# Patient Record
Sex: Female | Born: 1975 | Race: White | Hispanic: No | Smoking: Current every day smoker
Health system: Southern US, Community
[De-identification: ages and names within clinical notes are randomized; demographics above are authoritative.]

## PROBLEM LIST (undated history)

## (undated) ENCOUNTER — Ambulatory Visit

## (undated) ENCOUNTER — Encounter

## (undated) DIAGNOSIS — J069 Acute upper respiratory infection, unspecified: Secondary | ICD-10-CM

## (undated) DIAGNOSIS — I10 Essential (primary) hypertension: Secondary | ICD-10-CM

## (undated) DIAGNOSIS — T7840XA Allergy, unspecified, initial encounter: Secondary | ICD-10-CM

## (undated) DIAGNOSIS — J45909 Unspecified asthma, uncomplicated: Secondary | ICD-10-CM

## (undated) DIAGNOSIS — L309 Dermatitis, unspecified: Secondary | ICD-10-CM

## (undated) HISTORY — DX: Unspecified asthma, uncomplicated: J45.909

## (undated) HISTORY — PX: OVARIAN CYST DRAINAGE: SHX325

## (undated) HISTORY — DX: Allergy, unspecified, initial encounter: T78.40XA

## (undated) HISTORY — DX: Essential (primary) hypertension: I10

## (undated) HISTORY — PX: OTHER SURGICAL HISTORY: SHX169

## (undated) HISTORY — DX: Acute upper respiratory infection, unspecified: J06.9

---

## 2003-02-11 ENCOUNTER — Observation Stay (HOSPITAL_COMMUNITY): Admission: EM | Admit: 2003-02-11 | Discharge: 2003-02-12 | Payer: Self-pay | Admitting: Emergency Medicine

## 2004-08-22 ENCOUNTER — Emergency Department (HOSPITAL_COMMUNITY): Admission: EM | Admit: 2004-08-22 | Discharge: 2004-08-22 | Payer: Self-pay | Admitting: Family Medicine

## 2009-10-30 ENCOUNTER — Encounter: Admission: RE | Admit: 2009-10-30 | Discharge: 2009-10-30 | Payer: Self-pay | Admitting: Internal Medicine

## 2011-08-10 ENCOUNTER — Ambulatory Visit
Admission: RE | Admit: 2011-08-10 | Discharge: 2011-08-10 | Disposition: A | Discharge status: Home / Self Care | Payer: BC Managed Care – PPO | Source: Ambulatory Visit | Attending: Nurse Practitioner | Admitting: Nurse Practitioner

## 2011-08-10 ENCOUNTER — Other Ambulatory Visit: Payer: Self-pay | Admitting: Nurse Practitioner

## 2011-08-10 DIAGNOSIS — R05 Cough: Secondary | ICD-10-CM

## 2011-08-10 DIAGNOSIS — R059 Cough, unspecified: Secondary | ICD-10-CM

## 2011-08-10 DIAGNOSIS — R509 Fever, unspecified: Secondary | ICD-10-CM

## 2012-07-05 ENCOUNTER — Other Ambulatory Visit: Payer: Self-pay | Admitting: *Deleted

## 2012-07-05 DIAGNOSIS — I10 Essential (primary) hypertension: Secondary | ICD-10-CM

## 2012-07-05 MED ORDER — HYDROCHLOROTHIAZIDE 25 MG PO TABS: ORAL_TABLET | ORAL | Status: DC

## 2012-07-05 NOTE — Telephone Encounter (Signed)
  This message came to me. Looks like the refill did not go through. Please try again.

## 2012-07-06 ENCOUNTER — Other Ambulatory Visit: Payer: Self-pay | Admitting: *Deleted

## 2012-07-06 DIAGNOSIS — I10 Essential (primary) hypertension: Secondary | ICD-10-CM

## 2012-07-06 MED ORDER — HYDROCHLOROTHIAZIDE 25 MG PO TABS: ORAL_TABLET | ORAL | Status: DC

## 2012-07-19 ENCOUNTER — Other Ambulatory Visit: Payer: Self-pay | Admitting: *Deleted

## 2012-07-19 MED ORDER — FLUTICASONE-SALMETEROL 500-50 MCG/DOSE IN AEPB: INHALATION_SPRAY | RESPIRATORY_TRACT | Status: DC

## 2012-08-02 ENCOUNTER — Encounter: Payer: Self-pay | Admitting: Nurse Practitioner

## 2012-08-02 ENCOUNTER — Ambulatory Visit (INDEPENDENT_AMBULATORY_CARE_PROVIDER_SITE_OTHER): Payer: BC Managed Care – PPO | Admitting: Nurse Practitioner

## 2012-08-02 VITALS — BP 122/86 | HR 70 | Temp 97.5°F | Resp 20 | Ht 63.5 in | Wt 120.0 lb

## 2012-08-02 DIAGNOSIS — J45909 Unspecified asthma, uncomplicated: Secondary | ICD-10-CM

## 2012-08-02 DIAGNOSIS — L309 Dermatitis, unspecified: Secondary | ICD-10-CM | POA: Insufficient documentation

## 2012-08-02 DIAGNOSIS — I10 Essential (primary) hypertension: Secondary | ICD-10-CM

## 2012-08-02 DIAGNOSIS — Z79899 Other long term (current) drug therapy: Secondary | ICD-10-CM

## 2012-08-02 DIAGNOSIS — J069 Acute upper respiratory infection, unspecified: Secondary | ICD-10-CM | POA: Insufficient documentation

## 2012-08-02 DIAGNOSIS — F17200 Nicotine dependence, unspecified, uncomplicated: Secondary | ICD-10-CM

## 2012-08-02 DIAGNOSIS — J189 Pneumonia, unspecified organism: Secondary | ICD-10-CM | POA: Insufficient documentation

## 2012-08-02 DIAGNOSIS — M6740 Ganglion, unspecified site: Secondary | ICD-10-CM | POA: Insufficient documentation

## 2012-08-02 DIAGNOSIS — B079 Viral wart, unspecified: Secondary | ICD-10-CM

## 2012-08-02 DIAGNOSIS — L2089 Other atopic dermatitis: Secondary | ICD-10-CM

## 2012-08-02 DIAGNOSIS — L989 Disorder of the skin and subcutaneous tissue, unspecified: Secondary | ICD-10-CM | POA: Insufficient documentation

## 2012-08-02 DIAGNOSIS — J309 Allergic rhinitis, unspecified: Secondary | ICD-10-CM

## 2012-08-02 MED ORDER — HYDROCORTISONE 2.5 % EX OINT: TOPICAL_OINTMENT | Freq: Two times a day (BID) | CUTANEOUS | Status: DC

## 2012-08-02 MED ORDER — EPINEPHRINE 0.3 MG/0.3ML IJ DEVI: 0.3000 mg | Freq: Once | INTRAMUSCULAR | Status: DC

## 2012-08-02 MED ORDER — HYDROCHLOROTHIAZIDE 25 MG PO TABS: ORAL_TABLET | ORAL | Status: DC

## 2012-08-02 MED ORDER — PREDNISONE 10 MG PO TABS: 10.0000 mg | ORAL_TABLET | Freq: Every day | ORAL | Status: DC

## 2012-08-02 MED ORDER — FLUTICASONE-SALMETEROL 500-50 MCG/DOSE IN AEPB: INHALATION_SPRAY | RESPIRATORY_TRACT | Status: DC

## 2012-08-02 MED ORDER — ALBUTEROL SULFATE HFA 108 (90 BASE) MCG/ACT IN AERS: 2.0000 | INHALATION_SPRAY | Freq: Four times a day (QID) | RESPIRATORY_TRACT | Status: DC | PRN

## 2012-08-02 NOTE — Progress Notes (Signed)
Patient ID: Cindy Huynh, female   DOB: [birth year], 37 y.o.   MRN: [id number]   Allergies  Allergen Reactions  . Other     Avocados, nuts, red berries in wine seafood    Chief Complaint  Patient presents with  . Annual Exam    HPI: Patient is a 37 y.o. female seen in the office today for EV Reports she goes to GYN yearly  Would like to quit smoking  Using advair daily-- uses albuterol 1-2 times a week at most   Review of Systems:  Review of Systems  Constitutional: Negative for fever, chills and weight loss.       Doing well- recently got a separation and reports stress levels have improve  HENT: Negative for neck pain.   Eyes: Negative.        See eye MD regularly   Cardiovascular: Negative for chest pain, palpitations and leg swelling.  Gastrointestinal: Negative for nausea, vomiting, abdominal pain, diarrhea and constipation.  Genitourinary: Negative for dysuria, urgency and frequency.  Musculoskeletal: Negative.  Negative for myalgias and joint pain.  Skin: Negative for itching and rash.       Recurrent eczema uses PRN prednisone burst and taper   Neurological: Positive for headaches (behind right eye- takes advil and it goes away ).  Psychiatric/Behavioral: Negative for depression. The patient is not nervous/anxious and does not have insomnia.     Past Medical History  Diagnosis Date  . Allergy   . Asthma   . Hypertension    Past Surgical History  Procedure Laterality Date  . None     Social History:   reports that she has been smoking Cigarettes.  She has been smoking about 1.00 pack per day. She does not have any smokeless tobacco history on file. She reports that  drinks alcohol. She reports that she does not use illicit drugs.  History reviewed. No pertinent family history.  Medications: Patient's Medications  New Prescriptions   EPINEPHRINE (EPIPEN) 0.3 MG/0.3 ML DEVI    Inject 0.3 mLs (0.3 mg total) into the muscle once.  Previous Medications   EPINEPHRINE (EPIPEN IJ)    Inject as directed. Use as needed  Modified Medications   Modified Medication Previous Medication   ALBUTEROL (VENTOLIN HFA) 108 (90 BASE) MCG/ACT INHALER albuterol (VENTOLIN HFA) 108 (90 BASE) MCG/ACT inhaler      Inhale 2 puffs into the lungs every 6 (six) hours as needed for wheezing. Inhale two puffs daily for asthma    Inhale 2 puffs into the lungs every 6 (six) hours as needed for wheezing. Inhale two puffs daily for asthma   FLUTICASONE-SALMETEROL (ADVAIR) 500-50 MCG/DOSE AEPB Fluticasone-Salmeterol (ADVAIR) 500-50 MCG/DOSE AEPB      Inhale 1 puff twice a day to help breathing    Inhale 1 puff twice a day to help breathing   HYDROCHLOROTHIAZIDE (HYDRODIURIL) 25 MG TABLET hydrochlorothiazide (HYDRODIURIL) 25 MG tablet      Take one tablet once a day to help control blood pressure.    Take one tablet once a day to help control blood pressure.   HYDROCORTISONE 2.5 % OINTMENT hydrocortisone 2.5 % ointment      Apply topically 2 (two) times daily. Apply daily after shower    Apply topically 2 (two) times daily. Apply daily after shower   PREDNISONE (DELTASONE) 10 MG TABLET predniSONE (DELTASONE) 10 MG tablet      Take 1 tablet (10 mg total) by mouth daily. Take 2 tablets daily for eczema  Take 10 mg by mouth daily. Take 2 tablets daily for eczema  Discontinued Medications   No medications on file     Physical Exam:  Filed Vitals:   08/02/12 1515  BP: 122/86  Pulse: 70  Temp: 97.5 F (36.4 C)  Resp: 20  Height: 5' 3.5" (1.613 m)  Weight: 120 lb (54.432 kg)   Physical Exam  Constitutional: She is oriented to person, place, and time. She appears well-developed and well-nourished. No distress.  HENT:  Head: Normocephalic and atraumatic.  Right Ear: External ear normal.  Left Ear: External ear normal.  Nose: Nose normal.  Mouth/Throat: Oropharynx is clear and moist. No oropharyngeal exudate.  Eyes: Conjunctivae and EOM are normal. Pupils are equal,  round, and reactive to light.  Neck: Normal range of motion. Neck supple.  Cardiovascular: Normal rate, regular rhythm, normal heart sounds and intact distal pulses.   Pulmonary/Chest: Effort normal and breath sounds normal.  Abdominal: Soft. Bowel sounds are normal. She exhibits no distension. There is no tenderness.  Genitourinary:  has exam at GYN  Musculoskeletal: Normal range of motion. She exhibits no edema and no tenderness.  Neurological: She is alert and oriented to person, place, and time. She has normal reflexes. No cranial nerve deficit.  Skin: Skin is warm and dry. No rash noted. She is not diaphoretic. No erythema. No pallor.  Psychiatric: She has a normal mood and affect.   Assessment/Plan Unspecified essential hypertension Stable taking HCTZ only. Will get bmp today  Intrinsic asthma, unspecified Stable on current medications.   Allergic rhinitis, cause unspecified Stable on current medications. Will cont these medications.  Other atopic dermatitis and related conditions With recurrent eczema - pt uses prednose as needed for flares. Has not had recent flare will cont current medications   Tobacco use disorder Encouraged smoking cessation. Pt does not want to try any medication at this time but is thinking about cutting back on her amount of cigarette   Encounter for long-term (current) use of other medications Will do cbc and cmp    Wart on left posterior hand between thumb and first finger. Cryo pen applied and wart frozen. Pt given instructions on care

## 2012-08-02 NOTE — Assessment & Plan Note (Signed)
Stable on current medications. Will cont these medications.

## 2012-08-02 NOTE — Assessment & Plan Note (Signed)
Stable taking HCTZ only. Will get bmp today

## 2012-08-02 NOTE — Assessment & Plan Note (Signed)
Encouraged smoking cessation. Pt does not want to try any medication at this time but is thinking about cutting back on her amount of cigarette

## 2012-08-02 NOTE — Assessment & Plan Note (Signed)
Stable on current medications 

## 2012-08-02 NOTE — Patient Instructions (Addendum)

## 2012-08-02 NOTE — Assessment & Plan Note (Signed)
Will do cbc and cmp

## 2012-08-02 NOTE — Assessment & Plan Note (Signed)
With recurrent eczema - pt uses prednose as needed for flares. Has not had recent flare will cont current medications

## 2012-08-03 LAB — COMPREHENSIVE METABOLIC PANEL
AST: 20 IU/L (ref 0–40)
Alkaline Phosphatase: 36 IU/L — ABNORMAL LOW (ref 39–117)
BUN/Creatinine Ratio: 17 (ref 8–20)
CO2: 27 mmol/L (ref 19–28)
Calcium: 10.2 mg/dL (ref 8.7–10.2)
GFR calc Af Amer: 96 mL/min/{1.73_m2} (ref >59)
Globulin, Total: 2 g/dL (ref 1.5–4.5)
Potassium: 4.1 mmol/L (ref 3.5–5.2)
Sodium: 143 mmol/L (ref 134–144)
Total Bilirubin: 0.5 mg/dL (ref 0.0–1.2)

## 2012-08-03 LAB — CBC WITH DIFFERENTIAL/PLATELET
Basophils Absolute: 0 10*3/uL (ref 0.0–0.2)
HCT: 43.9 % (ref 34.0–46.6)
Immature Grans (Abs): 0 10*3/uL (ref 0.0–0.1)
Immature Granulocytes: 0 % (ref 0–2)
MCHC: 34.9 g/dL (ref 31.5–35.7)
MCV: 95 fL (ref 79–97)
Monocytes: 8 % (ref 4–12)
Neutrophils Absolute: 3.9 10*3/uL (ref 1.4–7.0)
Neutrophils Relative %: 53 % (ref 40–74)
RBC: 4.63 x10E6/uL (ref 3.77–5.28)
RDW: 13.5 % (ref 12.3–15.4)

## 2013-01-16 ENCOUNTER — Other Ambulatory Visit: Payer: Self-pay | Admitting: Internal Medicine

## 2013-01-23 ENCOUNTER — Emergency Department (HOSPITAL_COMMUNITY)
Admission: EM | Admit: 2013-01-23 | Discharge: 2013-01-23 | Disposition: A | Discharge status: Home / Self Care | Payer: BC Managed Care – PPO | Attending: Emergency Medicine | Admitting: Emergency Medicine

## 2013-01-23 ENCOUNTER — Encounter (HOSPITAL_COMMUNITY): Payer: Self-pay | Admitting: *Deleted

## 2013-01-23 ENCOUNTER — Emergency Department (HOSPITAL_COMMUNITY): Payer: BC Managed Care – PPO

## 2013-01-23 DIAGNOSIS — J45909 Unspecified asthma, uncomplicated: Secondary | ICD-10-CM | POA: Insufficient documentation

## 2013-01-23 DIAGNOSIS — I10 Essential (primary) hypertension: Secondary | ICD-10-CM | POA: Insufficient documentation

## 2013-01-23 DIAGNOSIS — Z872 Personal history of diseases of the skin and subcutaneous tissue: Secondary | ICD-10-CM | POA: Insufficient documentation

## 2013-01-23 DIAGNOSIS — Z79899 Other long term (current) drug therapy: Secondary | ICD-10-CM | POA: Insufficient documentation

## 2013-01-23 DIAGNOSIS — N83209 Unspecified ovarian cyst, unspecified side: Secondary | ICD-10-CM | POA: Insufficient documentation

## 2013-01-23 DIAGNOSIS — Z3202 Encounter for pregnancy test, result negative: Secondary | ICD-10-CM | POA: Insufficient documentation

## 2013-01-23 DIAGNOSIS — F17200 Nicotine dependence, unspecified, uncomplicated: Secondary | ICD-10-CM | POA: Insufficient documentation

## 2013-01-23 HISTORY — DX: Dermatitis, unspecified: L30.9

## 2013-01-23 LAB — CBC WITH DIFFERENTIAL/PLATELET
Basophils Absolute: 0 10*3/uL (ref 0.0–0.1)
Hemoglobin: 14.4 g/dL (ref 12.0–15.0)
Lymphocytes Relative: 32 % (ref 12–46)
Lymphs Abs: 2.2 10*3/uL (ref 0.7–4.0)
MCH: 33.6 pg (ref 26.0–34.0)
MCHC: 35.9 g/dL (ref 30.0–36.0)
Neutrophils Relative %: 54 % (ref 43–77)
Platelets: 216 10*3/uL (ref 150–400)
RBC: 4.29 MIL/uL (ref 3.87–5.11)
RDW: 12.5 % (ref 11.5–15.5)

## 2013-01-23 LAB — COMPREHENSIVE METABOLIC PANEL
ALT: 12 U/L (ref 0–35)
AST: 16 U/L (ref 0–37)
Albumin: 3.6 g/dL (ref 3.5–5.2)
Alkaline Phosphatase: 39 U/L (ref 39–117)
BUN: 11 mg/dL (ref 6–23)
Calcium: 8.9 mg/dL (ref 8.4–10.5)
Total Bilirubin: 0.1 mg/dL — ABNORMAL LOW (ref 0.3–1.2)

## 2013-01-23 LAB — GC/CHLAMYDIA PROBE AMP: CT Probe RNA: NEGATIVE

## 2013-01-23 LAB — POCT PREGNANCY, URINE: Preg Test, Ur: NEGATIVE

## 2013-01-23 LAB — ETHANOL: Alcohol, Ethyl (B): <11 mg/dL (ref 0–11)

## 2013-01-23 LAB — URINALYSIS, ROUTINE W REFLEX MICROSCOPIC
Glucose, UA: NEGATIVE mg/dL
Hgb urine dipstick: NEGATIVE
Leukocytes, UA: NEGATIVE
Specific Gravity, Urine: 1.024 (ref 1.005–1.030)
pH: 8 (ref 5.0–8.0)

## 2013-01-23 LAB — WET PREP, GENITAL: Trich, Wet Prep: NONE SEEN

## 2013-01-23 NOTE — ED Notes (Signed)
Pt states that she has been havign right flank pain that is intermittant and that is severe when it does start. Pt states pain wraps from her back to abdomen on her right. Pt states that she urinated at the pain started. Pt states she tried to pass gas and she was unable pt has ETOH on breath.

## 2013-01-23 NOTE — ED Notes (Signed)
Pt updated about [place] process and informed sometimes it can take a while for the [place] tech to arrive. Pt communicated understanding.

## 2013-01-23 NOTE — ED Provider Notes (Signed)
CSN: [id number]     Arrival date & time 01/23/13  0107 History   First MD Initiated Contact with Patient 01/23/13 0125     Chief Complaint  Patient presents with  . Abdominal Pain   (Consider location/radiation/quality/duration/timing/severity/associated sxs/prior Treatment) HPI This is a 37 year old female who had the sudden onset of severe right-sided abdominal pain radiating to her right flank. This occurred about one hour ago. It was worse with movement or ambulation. She describes it as a burning sensation. It is subsequently significantly improved and is now only mild. There was no associated nausea or vomiting. She is not aware of having any hematuria. She has had no dysuria. She has had no diarrhea. She has no vaginal bleeding or discharge.  Past Medical History  Diagnosis Date  . Allergy   . Asthma   . Hypertension   . Eczema    Past Surgical History  Procedure Laterality Date  . None     History reviewed. No pertinent family history. History  Substance Use Topics  . Smoking status: Current Every Day Smoker -- 1.00 packs/day    Types: Cigarettes  . Smokeless tobacco: Not on file  . Alcohol Use: Yes   OB History   Grav Para Term Preterm Abortions TAB SAB Ect Mult Living                 Review of Systems  All other systems reviewed and are negative.    Allergies  Peanuts; Shellfish allergy; Avocado; Macrolides and ketolides; Other; and Erythromycin  Home Medications   Current Outpatient Rx  Name  Route  Sig  Dispense  Refill  . albuterol (PROVENTIL HFA;VENTOLIN HFA) 108 (90 BASE) MCG/ACT inhaler   Inhalation   Inhale 2 puffs into the lungs every 6 (six) hours as needed for wheezing.         . cetirizine (ZYRTEC) 10 MG tablet   Oral   Take 10 mg by mouth daily.         . Fluticasone-Salmeterol (ADVAIR) 500-50 MCG/DOSE AEPB   Inhalation   Inhale 1 puff into the lungs every 12 (twelve) hours.         . hydrochlorothiazide (HYDRODIURIL) 25 MG  tablet   Oral   Take 25 mg by mouth daily.         . hydrocortisone 2.5 % ointment   Topical   Apply 1 application topically 3 (three) times daily.         Marland Kitchen ibuprofen (ADVIL,MOTRIN) 200 MG tablet   Oral   Take 400 mg by mouth every 6 (six) hours as needed for pain.         . predniSONE (DELTASONE) 10 MG tablet   Oral   Take 10 mg by mouth daily. Takes for 10 days for cyclical treatment for excema         . Pseudoeph-Doxylamine-DM-APAP (NYQUIL D COLD/FLU PO)   Oral   Take 30 mLs by mouth daily as needed (for cold).         . Skin Protectants, Misc. (EUCERIN) cream   Topical   Apply 1 application topically 3 (three) times daily.         Marland Kitchen EPINEPHrine (EPIPEN) 0.3 mg/0.3 mL DEVI   Intramuscular   Inject 0.3 mLs (0.3 mg total) into the muscle once.   1 Device   3    BP 116/72  Pulse 65  Temp(Src) 97.9 F (36.6 C) (Oral)  Resp 17  Ht 5\' 4"  (1.626 m)  Wt 125 lb 4 oz (56.813 kg)  BMI 21.49 kg/m2  SpO2 98%  Physical Exam General: Well-developed, well-nourished female in no acute distress; appearance consistent with age of record HENT: normocephalic; atraumatic Eyes: pupils equal, round and reactive to light; extraocular muscles intact Neck: supple Heart: regular rate and rhythm; no murmurs, rubs or gallops Lungs: clear to auscultation bilaterally Abdomen: soft; nondistended; right suprapubic tenderness; negative Murphy's sign; no masses or hepatosplenomegaly; bowel sounds present GU: No CVA tenderness; normal external genitalia; cervical os closed; no vaginal bleeding; no vaginal discharge; cervical motion tenderness; left adnexal tenderness Extremities: No deformity; full range of motion; pulses normal Neurologic: Awake, alert and oriented; motor function intact in all extremities and symmetric; no facial droop Skin: Warm and dry; eczematous rash of ankles Psychiatric: Normal mood and affect    ED Course  Procedures (including critical care time)  MDM    Nursing notes and vitals signs, including pulse oximetry, reviewed.  Summary of this visit's results, reviewed by myself:  Labs:  Results for orders placed during the hospital encounter of 01/23/13 (from the past 24 hour(s))  CBC WITH DIFFERENTIAL     Status: Abnormal   Collection Time    01/23/13  1:30 AM      Result Value Range   WBC 6.9  4.0 - 10.5 K/uL   RBC 4.29  3.87 - 5.11 MIL/uL   Hemoglobin 14.4  12.0 - 15.0 g/dL   HCT 11.9  14.7 - 82.9 %   MCV 93.5  78.0 - 100.0 fL   MCH 33.6  26.0 - 34.0 pg   MCHC 35.9  30.0 - 36.0 g/dL   RDW 56.2  13.0 - 86.5 %   Platelets 216  150 - 400 K/uL   Neutrophils Relative % 54  43 - 77 %   Neutro Abs 3.7  1.7 - 7.7 K/uL   Lymphocytes Relative 32  12 - 46 %   Lymphs Abs 2.2  0.7 - 4.0 K/uL   Monocytes Relative 6  3 - 12 %   Monocytes Absolute 0.4  0.1 - 1.0 K/uL   Eosinophils Relative 8 (*) 0 - 5 %   Eosinophils Absolute 0.5  0.0 - 0.7 K/uL   Basophils Relative 0  0 - 1 %   Basophils Absolute 0.0  0.0 - 0.1 K/uL  COMPREHENSIVE METABOLIC PANEL     Status: Abnormal   Collection Time    01/23/13  1:30 AM      Result Value Range   Sodium 138  135 - 145 mEq/L   Potassium 3.7  3.5 - 5.1 mEq/L   Chloride 98  96 - 112 mEq/L   CO2 27  19 - 32 mEq/L   Glucose, Bld 95  70 - 99 mg/dL   BUN 11  6 - 23 mg/dL   Creatinine, Ser 7.84  0.50 - 1.10 mg/dL   Calcium 8.9  8.4 - 69.6 mg/dL   Total Protein 6.4  6.0 - 8.3 g/dL   Albumin 3.6  3.5 - 5.2 g/dL   AST 16  0 - 37 U/L   ALT 12  0 - 35 U/L   Alkaline Phosphatase 39  39 - 117 U/L   Total Bilirubin 0.1 (*) 0.3 - 1.2 mg/dL   GFR calc non Af Amer >90  >90 mL/min   GFR calc Af Amer >90  >90 mL/min  ETHANOL     Status: None   Collection Time    01/23/13  1:55  AM      Result Value Range   Alcohol, Ethyl (B) <11  0 - 11 mg/dL  URINALYSIS, ROUTINE W REFLEX MICROSCOPIC     Status: None   Collection Time    02-01-2013  2:15 AM      Result Value Range   Color, Urine YELLOW  YELLOW   APPearance  CLEAR  CLEAR   Specific Gravity, Urine 1.024  1.005 - 1.030   pH 8.0  5.0 - 8.0   Glucose, UA NEGATIVE  NEGATIVE mg/dL   Hgb urine dipstick NEGATIVE  NEGATIVE   Bilirubin Urine NEGATIVE  NEGATIVE   Ketones, ur NEGATIVE  NEGATIVE mg/dL   Protein, ur NEGATIVE  NEGATIVE mg/dL   Urobilinogen, UA 0.2  0.0 - 1.0 mg/dL   Nitrite NEGATIVE  NEGATIVE   Leukocytes, UA NEGATIVE  NEGATIVE  WET PREP, GENITAL     Status: Abnormal   Collection Time    01-Feb-2013  2:20 AM      Result Value Range   Yeast Wet Prep HPF POC NONE SEEN  NONE SEEN   Trich, Wet Prep NONE SEEN  NONE SEEN   Clue Cells Wet Prep HPF POC NONE SEEN  NONE SEEN   WBC, Wet Prep HPF POC FEW (*) NONE SEEN  POCT PREGNANCY, URINE     Status: None   Collection Time    February 01, 2013  2:39 AM      Result Value Range   Preg Test, Ur NEGATIVE  NEGATIVE    Imaging Studies: US Transvaginal Non-ob  02/01/2013   *RADIOLOGY REPORT*  Clinical Data: Pelvic pain.  TRANSABDOMINAL AND TRANSVAGINAL ULTRASOUND OF PELVIS Technique:  Both transabdominal and transvaginal ultrasound examinations of the pelvis were performed. Transabdominal technique was performed for global imaging of the pelvis including uterus, ovaries, adnexal regions, and pelvic cul-de-sac.  It was necessary to proceed with endovaginal exam following the transabdominal exam to visualize the uterus and ovaries in greater detail.  Comparison:  None  Findings:  Uterus: Normal in size and appearance; measures 8.3 x 3.8 x 4.9 cm.  Endometrium: Not well characterized due to an intrauterine device, which is noted in expected position at the fundus of the uterus.  Right ovary:  Measures 4.4 x 3.2 x 3.9 cm.  A relatively complex 3.3 x 2.8 x 1.6 cm cyst is noted within the right ovary, with lace- like internal structure, raising question for a hemorrhagic cyst.  Left ovary: Normal appearance/no adnexal mass; measures 3.7 x 2.2 x 2.4 cm.  Limited Doppler evaluation demonstrates normal color Doppler blood flow  with respect to both ovaries; there is no evidence for ovarian torsion.  Other findings: A small amount of free fluid is noted at the right adnexa.  IMPRESSION:  1.  Relatively complex 3.3 cm right ovarian cyst, with lace-like internal septations, raising concern for a hemorrhagic cyst. 2.  Intrauterine device noted in expected position at the fundus of the uterus. 3.  Small amount of free fluid at the right adnexa is likely physiologic in nature.   Original Report Authenticated By: Tonia Ghent, M.D.   US Pelvis Complete  02/01/13   *RADIOLOGY REPORT*  Clinical Data: Pelvic pain.  TRANSABDOMINAL AND TRANSVAGINAL ULTRASOUND OF PELVIS Technique:  Both transabdominal and transvaginal ultrasound examinations of the pelvis were performed. Transabdominal technique was performed for global imaging of the pelvis including uterus, ovaries, adnexal regions, and pelvic cul-de-sac.  It was necessary to proceed with endovaginal exam following the transabdominal exam to visualize the  uterus and ovaries in greater detail.  Comparison:  None  Findings:  Uterus: Normal in size and appearance; measures 8.3 x 3.8 x 4.9 cm.  Endometrium: Not well characterized due to an intrauterine device, which is noted in expected position at the fundus of the uterus.  Right ovary:  Measures 4.4 x 3.2 x 3.9 cm.  A relatively complex 3.3 x 2.8 x 1.6 cm cyst is noted within the right ovary, with lace- like internal structure, raising question for a hemorrhagic cyst.  Left ovary: Normal appearance/no adnexal mass; measures 3.7 x 2.2 x 2.4 cm.  Limited Doppler evaluation demonstrates normal color Doppler blood flow with respect to both ovaries; there is no evidence for ovarian torsion.  Other findings: A small amount of free fluid is noted at the right adnexa.  IMPRESSION:  1.  Relatively complex 3.3 cm right ovarian cyst, with lace-like internal septations, raising concern for a hemorrhagic cyst. 2.  Intrauterine device noted in expected  position at the fundus of the uterus. 3.  Small amount of free fluid at the right adnexa is likely physiologic in nature.   Original Report Authenticated By: Tonia Ghent, M.D.   6:21 AM Pain-free at this time. Advised of ultrasound findings. She has followup appointment with her OB/GYN, Dr. Rana Snare, later this month for colposcopy.     Hanley Seamen, MD 01/23/13 [phone]

## 2013-01-23 NOTE — ED Notes (Signed)
Pt denies burning on urination, denies n/v/d, Denies SOB, denies fever/chills.

## 2013-04-14 ENCOUNTER — Other Ambulatory Visit: Payer: Self-pay | Admitting: Nurse Practitioner

## 2013-04-25 ENCOUNTER — Telehealth: Payer: Self-pay | Admitting: *Deleted

## 2013-04-25 NOTE — Telephone Encounter (Signed)
Received a letter from [employer] plan that states since patient is on Prednisone to consider Osteoporosis Screening. Per Dr. Burnice LoganPandey----Patient needs follow up appointment. 04/06/2013--LM for patient to return call 04/25/2013--LM for patient to call to make follow up appointment. Sent letter to be scanned.

## 2013-06-27 ENCOUNTER — Other Ambulatory Visit: Payer: Self-pay | Admitting: Nurse Practitioner

## 2013-07-20 ENCOUNTER — Ambulatory Visit (INDEPENDENT_AMBULATORY_CARE_PROVIDER_SITE_OTHER): Payer: BC Managed Care – PPO | Admitting: Nurse Practitioner

## 2013-07-20 ENCOUNTER — Encounter: Payer: Self-pay | Admitting: Nurse Practitioner

## 2013-07-20 VITALS — BP 120/84 | HR 71 | Temp 97.7°F | Ht 64.0 in | Wt 117.2 lb

## 2013-07-20 DIAGNOSIS — Z23 Encounter for immunization: Secondary | ICD-10-CM

## 2013-07-20 DIAGNOSIS — F17200 Nicotine dependence, unspecified, uncomplicated: Secondary | ICD-10-CM

## 2013-07-20 DIAGNOSIS — I10 Essential (primary) hypertension: Secondary | ICD-10-CM

## 2013-07-20 DIAGNOSIS — L309 Dermatitis, unspecified: Secondary | ICD-10-CM

## 2013-07-20 DIAGNOSIS — J309 Allergic rhinitis, unspecified: Secondary | ICD-10-CM

## 2013-07-20 DIAGNOSIS — J45909 Unspecified asthma, uncomplicated: Secondary | ICD-10-CM

## 2013-07-20 DIAGNOSIS — L259 Unspecified contact dermatitis, unspecified cause: Secondary | ICD-10-CM

## 2013-07-20 MED ORDER — PREDNISONE 20 MG PO TABS: ORAL_TABLET | ORAL | Status: DC

## 2013-07-20 MED ORDER — HYDROCORTISONE 2.5 % EX OINT: TOPICAL_OINTMENT | CUTANEOUS | Status: DC

## 2013-07-20 MED ORDER — FLUTICASONE-SALMETEROL 500-50 MCG/DOSE IN AEPB: 1.0000 | INHALATION_SPRAY | Freq: Two times a day (BID) | RESPIRATORY_TRACT | Status: DC

## 2013-07-20 MED ORDER — HYDROCHLOROTHIAZIDE 25 MG PO TABS: ORAL_TABLET | ORAL | Status: DC

## 2013-07-20 MED ORDER — ALBUTEROL SULFATE HFA 108 (90 BASE) MCG/ACT IN AERS: 2.0000 | INHALATION_SPRAY | Freq: Four times a day (QID) | RESPIRATORY_TRACT | Status: DC | PRN

## 2013-07-20 NOTE — Progress Notes (Signed)
Patient ID: Cindy Huynh, female   DOB: [birth year], 38 y.o.   MRN: [id number]    Allergies  Allergen Reactions  . Peanuts [Peanut Oil] Anaphylaxis    All Nuts cause same reaction  . Shellfish Allergy Anaphylaxis, Itching and Rash  . Avocado Nausea And Vomiting  . Macrolides And Ketolides Nausea And Vomiting  . Other Itching and Rash    All red berries, tomatoes  . Erythromycin Nausea Only    Chief Complaint  Patient presents with  . Medical Managment of Chronic Issues    f/u & refills (want predinisone refilled as well)  . Immunizations    will get Tdap today in office, also needs Pneumo    HPI: Patient is a 38 y.o. female seen in the office today for routine followup Has cut back on smoking to half of what she has before-- trying to save money-- has a plan to cut back further eczema has used prednisone 3 times in the last year, will sometimes have flares close together but other time she can go 7 month without  Asthma- no recent flares, well controlled, has not had resue inhaler refilled in 2 months Seasonal allergies really bad at this time.  Otherwise doing good Review of Systems:  Review of Systems  Constitutional: Negative for fever, chills and malaise/fatigue.  HENT: Positive for congestion (allergies). Negative for sore throat.   Eyes: Negative for blurred vision.  Respiratory: Negative for cough, shortness of breath and wheezing.   Cardiovascular: Negative for chest pain and leg swelling.  Gastrointestinal: Negative for abdominal pain, diarrhea and constipation.  Genitourinary: Negative for dysuria.  Musculoskeletal: Negative for joint pain and myalgias.  Skin: Positive for itching and rash.  Neurological: Negative for weakness and headaches.  Endo/Heme/Allergies: Positive for environmental allergies.  Psychiatric/Behavioral: Negative for depression. The patient is not nervous/anxious and does not have insomnia.       Past Medical History  Diagnosis Date  .  Allergy   . Asthma   . Hypertension   . Eczema    Past Surgical History  Procedure Laterality Date  . None    . Ovarian cyst drainage     Social History:   reports that she has been smoking Cigarettes.  She has been smoking about 1.00 pack per day. She does not have any smokeless tobacco history on file. She reports that she drinks alcohol. She reports that she does not use illicit drugs.  History reviewed. No pertinent family history.  Medications: Patient's Medications  New Prescriptions   No medications on file  Previous Medications   ALBUTEROL (PROVENTIL HFA;VENTOLIN HFA) 108 (90 BASE) MCG/ACT INHALER    Inhale 2 puffs into the lungs every 6 (six) hours as needed for wheezing.   CETIRIZINE (ZYRTEC) 10 MG TABLET    Take 10 mg by mouth daily.   EPINEPHRINE (EPIPEN) 0.3 MG/0.3 ML DEVI    Inject 0.3 mLs (0.3 mg total) into the muscle once.   FLUTICASONE-SALMETEROL (ADVAIR) 500-50 MCG/DOSE AEPB    Inhale 1 puff into the lungs every 12 (twelve) hours.   HYDROCHLOROTHIAZIDE (HYDRODIURIL) 25 MG TABLET    TAKE 1 TABLET BY MOUTH DAILY TO HELP BLOOD PRESSURE   HYDROCORTISONE 2.5 % OINTMENT    APPLY TO RASH DAILY   IBUPROFEN (ADVIL,MOTRIN) 200 MG TABLET    Take 400 mg by mouth every 6 (six) hours as needed for pain.   PREDNISONE (DELTASONE) 10 MG TABLET    Take 10 mg by mouth daily. Takes  for 10 days for cyclical treatment for excema   PSEUDOEPH-DOXYLAMINE-DM-APAP (NYQUIL D COLD/FLU PO)    Take 30 mLs by mouth daily as needed (for cold).   SKIN PROTECTANTS, MISC. (EUCERIN) CREAM    Apply 1 application topically 3 (three) times daily.  Modified Medications   No medications on file  Discontinued Medications   HYDROCHLOROTHIAZIDE (HYDRODIURIL) 25 MG TABLET    TAKE 1 TABLET BY MOUTH DAILY TO HELP BLOOD PRESSURE     Physical Exam:  Filed Vitals:   07/20/13 0844  BP: 120/84  Pulse: 71  Temp: 97.7 F (36.5 C)  TempSrc: Oral  Height: 5\' 4"  (1.626 m)  Weight: 117 lb 3.2 oz (53.162 kg)    SpO2: 98%    Physical Exam  Constitutional: She is oriented to person, place, and time and well-developed, well-nourished, and in no distress.  HENT:  Head: Normocephalic and atraumatic.  Mouth/Throat: Oropharynx is clear and moist. No oropharyngeal exudate.  Eyes: Conjunctivae and EOM are normal. Pupils are equal, round, and reactive to light.  Neck: Normal range of motion. Neck supple. No thyromegaly present.  Cardiovascular: Normal rate, regular rhythm and normal heart sounds.   Pulmonary/Chest: Effort normal and breath sounds normal. No respiratory distress.  Abdominal: Soft. Bowel sounds are normal. She exhibits no distension.  Musculoskeletal: She exhibits no edema and no tenderness.  Neurological: She is alert and oriented to person, place, and time.  Skin: Skin is warm and dry.  Psychiatric: Affect normal.     Labs reviewed: Basic Metabolic Panel:  Recent Labs  63/84/66 1628 01/23/13 0130  NA 143 138  K 4.1 3.7  CL 102 98  CO2 27 27  GLUCOSE 75 95  BUN 15 11  CREATININE 0.88 0.65  CALCIUM 10.2 8.9   Liver Function Tests:  Recent Labs  08/02/12 1628 01/23/13 0130  AST 20 16  ALT 13 12  ALKPHOS 36* 39  BILITOT 0.5 0.1*  PROT 6.7 6.4  ALBUMIN  --  3.6   No results found for this basename: LIPASE, AMYLASE,  in the last 8760 hours No results found for this basename: AMMONIA,  in the last 8760 hours CBC:  Recent Labs  08/02/12 1628 01/23/13 0130  WBC 7.2 6.9  NEUTROABS 3.9 3.7  HGB 15.3 14.4  HCT 43.9 40.1  MCV 95 93.5  PLT  --  216     Assessment/Plan 1. Unspecified essential hypertension -stable at this time, conts HCTZ - hydrochlorothiazide (HYDRODIURIL) 25 MG tablet; TAKE 1 TABLET BY MOUTH DAILY TO HELP BLOOD PRESSURE  Dispense: 90 tablet; Refill: 1 - CMP today   2. Intrinsic asthma, unspecified -stable, actually has improved recently with decrease of smoking - albuterol (PROVENTIL HFA;VENTOLIN HFA) 108 (90 BASE) MCG/ACT inhaler;  Inhale 2 puffs into the lungs every 6 (six) hours as needed for wheezing.  Dispense: 3.7 g; Refill: 2 - Fluticasone-Salmeterol (ADVAIR) 500-50 MCG/DOSE AEPB; Inhale 1 puff into the lungs every 12 (twelve) hours.  Dispense: 60 each; Refill: 2  3. Tobacco use disorder -has cut back on smoking, encouraged to cont to decrease amt of cigarettes -PNE vaccine given    4. Eczema -chronic, uses prednisone when needed, given Rx for prednisone 20 mg 2 tablets for 1 week as needed, #14 R 3 - hydrocortisone 2.5 % ointment; APPLY TO RASH DAILY  Dispense: 858 g; Refill: 1 -will get cbc  5. Seasonal Allergies  -worse during spring -conts zyrtec  5. Preventive Discussed to quit smoking , will get  dexa scan due to ongoing prednisone use from eczema, gets yearly PAP with GYN, sees eye doctor regularly, will get TDAP and PNE vaccines today -lipid panel

## 2013-07-20 NOTE — Patient Instructions (Signed)
Will get blood work today  Smoking Cessation Quitting smoking is important to your health and has many advantages. However, it is not always easy to quit since nicotine is a very addictive drug. Often times, people try 3 times or more before being able to quit. This document explains the best ways for you to prepare to quit smoking. Quitting takes hard work and a lot of effort, but you can do it. ADVANTAGES OF QUITTING SMOKING  You will live longer, feel better, and live better.  Your body will feel the impact of quitting smoking almost immediately.  Within 20 minutes, blood pressure decreases. Your pulse returns to its normal level.  After 8 hours, carbon monoxide levels in the blood return to normal. Your oxygen level increases.  After 24 hours, the chance of having a heart attack starts to decrease. Your breath, hair, and body stop smelling like smoke.  After 48 hours, damaged nerve endings begin to recover. Your sense of taste and smell improve.  After 72 hours, the body is virtually free of nicotine. Your bronchial tubes relax and breathing becomes easier.  After 2 to 12 weeks, lungs can hold more air. Exercise becomes easier and circulation improves.  The risk of having a heart attack, stroke, cancer, or lung disease is greatly reduced.  After 1 year, the risk of coronary heart disease is cut in half.  After 5 years, the risk of stroke falls to the same as a nonsmoker.  After 10 years, the risk of lung cancer is cut in half and the risk of other cancers decreases significantly.  After 15 years, the risk of coronary heart disease drops, usually to the level of a nonsmoker.  If you are pregnant, quitting smoking will improve your chances of having a healthy baby.  The people you live with, especially any children, will be healthier.  You will have extra money to spend on things other than cigarettes. QUESTIONS TO THINK ABOUT BEFORE ATTEMPTING TO QUIT You may want to talk  about your answers with your caregiver.  Why do you want to quit?  If you tried to quit in the past, what helped and what did not?  What will be the most difficult situations for you after you quit? How will you plan to handle them?  Who can help you through the tough times? Your family? Friends? A caregiver?  What pleasures do you get from smoking? What ways can you still get pleasure if you quit? Here are some questions to ask your caregiver:  How can you help me to be successful at quitting?  What medicine do you think would be best for me and how should I take it?  What should I do if I need more help?  What is smoking withdrawal like? How can I get information on withdrawal? GET READY  Set a quit date.  Change your environment by getting rid of all cigarettes, ashtrays, matches, and lighters in your home, car, or work. Do not let people smoke in your home.  Review your past attempts to quit. Think about what worked and what did not. GET SUPPORT AND ENCOURAGEMENT You have a better chance of being successful if you have help. You can get support in many ways.  Tell your family, friends, and co-workers that you are going to quit and need their support. Ask them not to smoke around you.  Get individual, group, or telephone counseling and support. Programs are available at [facility] and health centers.  Call your local health department for information about programs in your area.  Spiritual beliefs and practices may help some smokers quit.  Download a "quit meter" on your computer to keep track of quit statistics, such as how long you have gone without smoking, cigarettes not smoked, and money saved.  Get a self-help book about quitting smoking and staying off of tobacco. LEARN NEW SKILLS AND BEHAVIORS  Distract yourself from urges to smoke. Talk to someone, go for a walk, or occupy your time with a task.  Change your normal routine. Take a different route to work.  Drink tea instead of coffee. Eat breakfast in a different place.  Reduce your stress. Take a hot bath, exercise, or read a book.  Plan something enjoyable to do every day. Reward yourself for not smoking.  Explore interactive web-based programs that specialize in helping you quit. GET MEDICINE AND USE IT CORRECTLY Medicines can help you stop smoking and decrease the urge to smoke. Combining medicine with the above behavioral methods and support can greatly increase your chances of successfully quitting smoking.  Nicotine replacement therapy helps deliver nicotine to your body without the negative effects and risks of smoking. Nicotine replacement therapy includes nicotine gum, lozenges, inhalers, nasal sprays, and skin patches. Some may be available over-the-counter and others require a prescription.  Antidepressant medicine helps people abstain from smoking, but how this works is unknown. This medicine is available by prescription.  Nicotinic receptor partial agonist medicine simulates the effect of nicotine in your brain. This medicine is available by prescription. Ask your caregiver for advice about which medicines to use and how to use them based on your health history. Your caregiver will tell you what side effects to look out for if you choose to be on a medicine or therapy. Carefully read the information on the package. Do not use any other product containing nicotine while using a nicotine replacement product.  RELAPSE OR DIFFICULT SITUATIONS Most relapses occur within the first 3 months after quitting. Do not be discouraged if you start smoking again. Remember, most people try several times before finally quitting. You may have symptoms of withdrawal because your body is used to nicotine. You may crave cigarettes, be irritable, feel very hungry, cough often, get headaches, or have difficulty concentrating. The withdrawal symptoms are only temporary. They are strongest when you first quit,  but they will go away within 10 14 days. To reduce the chances of relapse, try to:  Avoid drinking alcohol. Drinking lowers your chances of successfully quitting.  Reduce the amount of caffeine you consume. Once you quit smoking, the amount of caffeine in your body increases and can give you symptoms, such as a rapid heartbeat, sweating, and anxiety.  Avoid smokers because they can make you want to smoke.  Do not let weight gain distract you. Many smokers will gain weight when they quit, usually less than 10 pounds. Eat a healthy diet and stay active. You can always lose the weight gained after you quit.  Find ways to improve your mood other than smoking. FOR MORE INFORMATION  www.smokefree.gov  Document Released: 03/31/2001 Document Revised: 10/06/2011 Document Reviewed: 07/16/2011 [facility] Patient Information 2014 [place], [place].

## 2013-07-21 LAB — CBC WITH DIFFERENTIAL
Basophils Absolute: 0 10*3/uL (ref 0.0–0.2)
Basos: 1 %
EOS: 6 %
Eosinophils Absolute: 0.3 10*3/uL (ref 0.0–0.4)
HCT: 44.9 % (ref 34.0–46.6)
Hemoglobin: 15.5 g/dL (ref 11.1–15.9)
IMMATURE GRANS (ABS): 0 10*3/uL (ref 0.0–0.1)
Immature Granulocytes: 0 %
Lymphocytes Absolute: 1.7 10*3/uL (ref 0.7–3.1)
Lymphs: 35 %
MCH: 32.8 pg (ref 26.6–33.0)
MCHC: 34.5 g/dL (ref 31.5–35.7)
MCV: 95 fL (ref 79–97)
MONOCYTES: 7 %
MONOS ABS: 0.3 10*3/uL (ref 0.1–0.9)
NEUTROS PCT: 51 %
Neutrophils Absolute: 2.6 10*3/uL (ref 1.4–7.0)
Platelets: 269 10*3/uL (ref 150–379)
RBC: 4.73 x10E6/uL (ref 3.77–5.28)
RDW: 13.4 % (ref 12.3–15.4)
WBC: 5 10*3/uL (ref 3.4–10.8)

## 2013-07-21 LAB — LIPID PANEL
CHOL/HDL RATIO: 2.5 ratio (ref 0.0–4.4)
Cholesterol, Total: 167 mg/dL (ref 100–199)
HDL: 66 mg/dL (ref >39)
LDL Calculated: 86 mg/dL (ref 0–99)
TRIGLYCERIDES: 73 mg/dL (ref 0–149)
VLDL Cholesterol Cal: 15 mg/dL (ref 5–40)

## 2013-07-21 LAB — COMPREHENSIVE METABOLIC PANEL
A/G RATIO: 2.7 — AB (ref 1.1–2.5)
ALT: 6 IU/L (ref 0–32)
AST: 16 IU/L (ref 0–40)
Albumin: 4.6 g/dL (ref 3.5–5.5)
Alkaline Phosphatase: 36 IU/L — ABNORMAL LOW (ref 39–117)
BUN/Creatinine Ratio: 12 (ref 8–20)
BUN: 11 mg/dL (ref 6–20)
CALCIUM: 9.8 mg/dL (ref 8.7–10.2)
CO2: 26 mmol/L (ref 18–29)
Chloride: 98 mmol/L (ref 97–108)
Creatinine, Ser: 0.9 mg/dL (ref 0.57–1.00)
GFR, EST AFRICAN AMERICAN: 94 mL/min/{1.73_m2} (ref >59)
GFR, EST NON AFRICAN AMERICAN: 81 mL/min/{1.73_m2} (ref >59)
GLOBULIN, TOTAL: 1.7 g/dL (ref 1.5–4.5)
GLUCOSE: 89 mg/dL (ref 65–99)
POTASSIUM: 4 mmol/L (ref 3.5–5.2)
Sodium: 140 mmol/L (ref 134–144)
TOTAL PROTEIN: 6.3 g/dL (ref 6.0–8.5)
Total Bilirubin: 0.4 mg/dL (ref 0.0–1.2)

## 2013-07-26 ENCOUNTER — Other Ambulatory Visit: Payer: Self-pay | Admitting: Nurse Practitioner

## 2013-07-26 DIAGNOSIS — Z79899 Other long term (current) drug therapy: Secondary | ICD-10-CM

## 2013-07-26 DIAGNOSIS — L309 Dermatitis, unspecified: Secondary | ICD-10-CM

## 2013-08-11 ENCOUNTER — Ambulatory Visit (HOSPITAL_COMMUNITY): Payer: BC Managed Care – PPO

## 2013-08-28 ENCOUNTER — Ambulatory Visit (HOSPITAL_COMMUNITY): Payer: BC Managed Care – PPO | Attending: Nurse Practitioner

## 2013-09-09 DIAGNOSIS — Z79899 Other long term (current) drug therapy: Secondary | ICD-10-CM | POA: Insufficient documentation

## 2013-09-09 DIAGNOSIS — L02619 Cutaneous abscess of unspecified foot: Secondary | ICD-10-CM | POA: Insufficient documentation

## 2013-09-09 DIAGNOSIS — I10 Essential (primary) hypertension: Secondary | ICD-10-CM | POA: Insufficient documentation

## 2013-09-09 DIAGNOSIS — L03119 Cellulitis of unspecified part of limb: Principal | ICD-10-CM

## 2013-09-09 DIAGNOSIS — R269 Unspecified abnormalities of gait and mobility: Secondary | ICD-10-CM | POA: Insufficient documentation

## 2013-09-09 DIAGNOSIS — F17200 Nicotine dependence, unspecified, uncomplicated: Secondary | ICD-10-CM | POA: Insufficient documentation

## 2013-09-09 DIAGNOSIS — Z792 Long term (current) use of antibiotics: Secondary | ICD-10-CM | POA: Insufficient documentation

## 2013-09-09 DIAGNOSIS — J45909 Unspecified asthma, uncomplicated: Secondary | ICD-10-CM | POA: Insufficient documentation

## 2013-09-09 DIAGNOSIS — R42 Dizziness and giddiness: Secondary | ICD-10-CM | POA: Insufficient documentation

## 2013-09-10 ENCOUNTER — Emergency Department (HOSPITAL_COMMUNITY)
Admission: EM | Admit: 2013-09-10 | Discharge: 2013-09-10 | Disposition: A | Discharge status: Home / Self Care | Payer: BC Managed Care – PPO | Attending: Emergency Medicine | Admitting: Emergency Medicine

## 2013-09-10 ENCOUNTER — Emergency Department (HOSPITAL_COMMUNITY): Payer: BC Managed Care – PPO

## 2013-09-10 ENCOUNTER — Encounter (HOSPITAL_COMMUNITY): Payer: Self-pay | Admitting: Emergency Medicine

## 2013-09-10 DIAGNOSIS — M79672 Pain in left foot: Secondary | ICD-10-CM

## 2013-09-10 DIAGNOSIS — L03116 Cellulitis of left lower limb: Secondary | ICD-10-CM

## 2013-09-10 MED ORDER — HYDROCODONE-ACETAMINOPHEN 5-325 MG PO TABS
1.0000 | ORAL_TABLET | Freq: Once | ORAL | Status: AC
  Administered 2013-09-10: 1 via ORAL
  Filled 2013-09-10: qty 1

## 2013-09-10 MED ORDER — HYDROCODONE-ACETAMINOPHEN 5-325 MG PO TABS: 1.0000 | ORAL_TABLET | ORAL | Status: DC | PRN

## 2013-09-10 MED ORDER — CEPHALEXIN 500 MG PO CAPS: 500.0000 mg | ORAL_CAPSULE | Freq: Three times a day (TID) | ORAL | Status: DC

## 2013-09-10 MED ORDER — IBUPROFEN 200 MG PO TABS
600.0000 mg | ORAL_TABLET | Freq: Once | ORAL | Status: AC
  Administered 2013-09-10: 600 mg via ORAL
  Filled 2013-09-10: qty 3

## 2013-09-10 NOTE — Discharge Instructions (Signed)
If you were given medicines take as directed.  If you are on coumadin or contraceptives realize their levels and effectiveness is altered by many different medicines.  If you have any reaction (rash, tongues swelling, other) to the medicines stop taking and see a physician.  Take tylenol for pain and fevers along with ibuprofen every 6 hrs. Elevate and ice foot. For severe pain take norco or vicodin however realize they have the potential for addiction and it can make you sleepy and has tylenol in it.  No operating machinery while taking.  Please follow up as directed and return to the ER or see a physician for new or worsening symptoms persistent fevers, rapidly spreading redness.  Thank you. Filed Vitals:   09/10/13 0014 09/10/13 0159  BP: 124/90 131/89  Pulse: 92 88  Temp: 98.6 F (37 C)   TempSrc: Oral   Resp:  16  Height: 5\' 4"  (1.626 m)   Weight: 116 lb (52.617 kg)   SpO2: 98% 100%

## 2013-09-10 NOTE — ED Provider Notes (Signed)
CSN: [id number]     Arrival date & time 09/09/13  2356 History   First MD Initiated Contact with Patient 09/10/13 0159     Chief Complaint  Patient presents with  . Foot Pain     (Consider location/radiation/quality/duration/timing/severity/associated sxs/prior Treatment) HPI Comments: Patient with asthma eczema and allergy history presents with left dorsal foot pain since Saturday. No injuries recall. Patient if Gerilyn Pilgrim beverages but definitely did not twist or injure her foot. Patient denies fevers or chills. No other joints hurting. Patient is on prednisone for asthma. Pain worse with palpation walking. No history of arthritis.  Patient is a 38 y.o. female presenting with lower extremity pain. The history is provided by the patient.  Foot Pain This is a new problem.    Past Medical History  Diagnosis Date  . Allergy   . Asthma   . Hypertension   . Eczema    Past Surgical History  Procedure Laterality Date  . None    . Ovarian cyst drainage     No family history on file. History  Substance Use Topics  . Smoking status: Current Every Day Smoker -- 1.00 packs/day    Types: Cigarettes  . Smokeless tobacco: Not on file  . Alcohol Use: Yes     Comment: socially   OB History   Grav Para Term Preterm Abortions TAB SAB Ect Mult Living                 Review of Systems  Constitutional: Negative for fever and chills.  Gastrointestinal: Negative for vomiting.  Musculoskeletal: Positive for gait problem.  Skin: Positive for rash.  Neurological: Positive for light-headedness.      Allergies  Peanuts; Shellfish allergy; Avocado; Macrolides and ketolides; Other; and Erythromycin  Home Medications   Prior to Admission medications   Medication Sig Start Date End Date Taking? Authorizing Provider  albuterol (PROVENTIL HFA;VENTOLIN HFA) 108 (90 BASE) MCG/ACT inhaler Inhale 2 puffs into the lungs every 6 (six) hours as needed for wheezing. 07/20/13   Claudie Revering, NP   cephALEXin (KEFLEX) 500 MG capsule Take 1 capsule (500 mg total) by mouth 3 (three) times daily. 09/10/13   Enid Skeens, MD  cetirizine (ZYRTEC) 10 MG tablet Take 10 mg by mouth daily.    Historical Provider, MD  EPINEPHrine (EPIPEN) 0.3 mg/0.3 mL DEVI Inject 0.3 mLs (0.3 mg total) into the muscle once. 08/02/12   Claudie Revering, NP  Fluticasone-Salmeterol (ADVAIR) 500-50 MCG/DOSE AEPB Inhale 1 puff into the lungs every 12 (twelve) hours. 07/20/13   Claudie Revering, NP  hydrochlorothiazide (HYDRODIURIL) 25 MG tablet TAKE 1 TABLET BY MOUTH DAILY TO HELP BLOOD PRESSURE 07/20/13   Claudie Revering, NP  HYDROcodone-acetaminophen (NORCO) 5-325 MG per tablet Take 1-2 tablets by mouth every 4 (four) hours as needed. 09/10/13   Enid Skeens, MD  hydrocortisone 2.5 % ointment APPLY TO RASH DAILY 07/20/13   Claudie Revering, NP  ibuprofen (ADVIL,MOTRIN) 200 MG tablet Take 400 mg by mouth every 6 (six) hours as needed for pain.    Historical Provider, MD  predniSONE (DELTASONE) 20 MG tablet Take 2 tablets daily for 1 week 07/20/13   Claudie Revering, NP  Pseudoeph-Doxylamine-DM-APAP (NYQUIL D COLD/FLU PO) Take 30 mLs by mouth daily as needed (for cold).    Historical Provider, MD  Skin Protectants, Misc. (EUCERIN) cream Apply 1 application topically 3 (three) times daily.    Historical Provider, MD   BP 133/94  Pulse 91  Temp(Src) 98.6 F (37 C) (Oral)  Resp 18  Ht 5\' 4"  (1.626 m)  Wt 116 lb (52.617 kg)  BMI 19.90 kg/m2  SpO2 96% Physical Exam  Nursing note and vitals reviewed. Constitutional: She appears well-developed and well-nourished. No distress.  Cardiovascular: Normal rate.   Pulmonary/Chest: Effort normal.  Musculoskeletal: She exhibits tenderness.  Neurological: She is alert.  Skin: Skin is warm. There is erythema.  Patient has significant pain/hypersensitive to palpation left dorsal foot with mild erythema and mild warmth. Patient has eczema on bilateral dorsal feet. No induration or  crepitus. Neurovascular intact. No ankle pain on palpation.    ED Course  Procedures (including critical care time) Labs Review Labs Reviewed - No data to display  Imaging Review Dg Foot Complete Left  09/10/2013   CLINICAL DATA:  Redness and swelling.  EXAM: LEFT FOOT - COMPLETE 3+ VIEW  COMPARISON:  None.  FINDINGS: There is no evidence of fracture or dislocation. There is no evidence of arthropathy or other focal bone abnormality. Soft tissues are unremarkable.  IMPRESSION: Negative.   Electronically Signed   By: Awilda Metroourtnay  Bloomer   On: 09/10/2013 01:40     EKG Interpretation None      MDM   Final diagnoses:  Cellulitis of left foot  Left foot pain   Differential includes muscle skeletal versus cellulitis. With no fall or extra unremarkable with significant pain concern for early cellulitis. Plan for pain meds antibiotics and close followup outpatient. Norco and ibuprofen in ED. Xray reviewed no acute findings.  Results and differential diagnosis were discussed with the patient/parent/guardian. Close follow up outpatient was discussed, comfortable with the plan.   Filed Vitals:   09/10/13 0159 09/10/13 0200 09/10/13 0215 09/10/13 0230  BP: 131/89 127/87 131/87 133/94  Pulse: 88 86 85 91  Temp:      TempSrc:      Resp: 16 16 23 18   Height:      Weight:      SpO2: 100% 98% 97% 96%        Enid SkeensJoshua M Dallis Czaja, MD 09/10/13 [phone]

## 2013-09-10 NOTE — ED Notes (Signed)
Patient Left foot started hurting Saturday at 1pm, now swollen; Patient denies falling, spraining. Patient has had a couple of beers, but no medication taken.

## 2013-10-04 ENCOUNTER — Encounter: Payer: Self-pay | Admitting: Nurse Practitioner

## 2013-10-04 ENCOUNTER — Ambulatory Visit (INDEPENDENT_AMBULATORY_CARE_PROVIDER_SITE_OTHER): Payer: BC Managed Care – PPO | Admitting: Nurse Practitioner

## 2013-10-04 VITALS — BP 112/80 | HR 76 | Temp 98.8°F | Resp 18 | Ht 64.0 in | Wt 115.0 lb

## 2013-10-04 DIAGNOSIS — L2089 Other atopic dermatitis: Secondary | ICD-10-CM

## 2013-10-04 MED ORDER — CEPHALEXIN 500 MG PO CAPS: 500.0000 mg | ORAL_CAPSULE | Freq: Three times a day (TID) | ORAL | Status: DC

## 2013-10-04 MED ORDER — FLUOCINONIDE 0.05 % EX GEL: 1.0000 "application " | Freq: Two times a day (BID) | CUTANEOUS | Status: DC

## 2013-10-04 NOTE — Progress Notes (Signed)
Patient ID: Cindy Huynh, female   DOB: 09-23-1975, 38 y.o.   MRN: [id number]    Allergies  Allergen Reactions  . Peanuts [Peanut Oil] Anaphylaxis    All Nuts cause same reaction  . Shellfish Allergy Anaphylaxis, Itching and Rash  . Avocado Nausea And Vomiting  . Macrolides And Ketolides Nausea And Vomiting  . Other Itching and Rash    All red berries, tomatoes  . Erythromycin Nausea Only    Chief Complaint  Patient presents with  . Acute Visit    rash    HPI: Patient is a 38 y.o. female seen in the office today for psoriasis  Was seen in the ED due to foot pain and was diagnosed with cellulitis and was treated with keflex which helped. Still has open areas and foot is still slighty warm but better.  Has been working at a new job that has called her skin to flare up more, would like something topical to take for inflammation once the infection goes away. No fevers or chills. Still having some pain to ankle but has improved.    Review of Systems:  Review of Systems  Constitutional: Negative for fever, chills and malaise/fatigue.  Eyes: Negative for blurred vision.  Respiratory: Negative for shortness of breath and wheezing.   Cardiovascular: Negative for chest pain and leg swelling.  Gastrointestinal: Negative for abdominal pain, diarrhea and constipation.  Genitourinary: Negative for dysuria.  Musculoskeletal: Negative for myalgias.  Skin: Positive for itching and rash.  Neurological: Negative for weakness.  Endo/Heme/Allergies: Positive for environmental allergies.     Past Medical History  Diagnosis Date  . Allergy   . Asthma   . Hypertension   . Eczema    Past Surgical History  Procedure Laterality Date  . None    . Ovarian cyst drainage     Social History:   reports that she has been smoking Cigarettes.  She has been smoking about 1.00 pack per day. She does not have any smokeless tobacco history on file. She reports that she drinks alcohol. She reports that  she does not use illicit drugs.  No family history on file.  Medications: Patient's Medications  New Prescriptions   No medications on file  Previous Medications   ALBUTEROL (PROVENTIL HFA;VENTOLIN HFA) 108 (90 BASE) MCG/ACT INHALER    Inhale 2 puffs into the lungs every 6 (six) hours as needed for wheezing.   CETIRIZINE (ZYRTEC) 10 MG TABLET    Take 10 mg by mouth daily.   EPINEPHRINE (EPIPEN) 0.3 MG/0.3 ML DEVI    Inject 0.3 mLs (0.3 mg total) into the muscle once.   FLUTICASONE-SALMETEROL (ADVAIR) 500-50 MCG/DOSE AEPB    Inhale 1 puff into the lungs every 12 (twelve) hours.   HYDROCHLOROTHIAZIDE (HYDRODIURIL) 25 MG TABLET    TAKE 1 TABLET BY MOUTH DAILY TO HELP BLOOD PRESSURE   HYDROCORTISONE 2.5 % OINTMENT    APPLY TO RASH DAILY   PREDNISONE (DELTASONE) 20 MG TABLET    Take 2 tablets daily for 1 week   SKIN PROTECTANTS, MISC. (EUCERIN) CREAM    Apply 1 application topically 3 (three) times daily.  Modified Medications   No medications on file  Discontinued Medications   CEPHALEXIN (KEFLEX) 500 MG CAPSULE    Take 1 capsule (500 mg total) by mouth 3 (three) times daily.   HYDROCODONE-ACETAMINOPHEN (NORCO) 5-325 MG PER TABLET    Take 1-2 tablets by mouth every 4 (four) hours as needed.   IBUPROFEN (ADVIL,MOTRIN) 200  MG TABLET    Take 400 mg by mouth every 6 (six) hours as needed for pain.   PSEUDOEPH-DOXYLAMINE-DM-APAP (NYQUIL D COLD/FLU PO)    Take 30 mLs by mouth daily as needed (for cold).     Physical Exam:  Filed Vitals:   10/04/13 1521  BP: 112/80  Pulse: 76  Temp: 98.8 F (37.1 C)  TempSrc: Oral  Resp: 18  Height: 5\' 4"  (1.626 m)  Weight: 115 lb (52.164 kg)   Physical Exam  Constitutional: She is oriented to person, place, and time and well-developed, well-nourished, and in no distress.  Neck: Normal range of motion. Neck supple.  Cardiovascular: Normal rate, regular rhythm and normal heart sounds.   Pulmonary/Chest: Effort normal and breath sounds normal. No  respiratory distress.  Abdominal: Soft. Bowel sounds are normal.  Musculoskeletal: She exhibits no edema and no tenderness.  Neurological: She is alert and oriented to person, place, and time.  Skin: Skin is warm and dry. Rash (left dorsal foot with mild erythema and warmth. positive for eczema on bilateral dorsal feet and to hands) noted.  Psychiatric: Affect normal.    Labs reviewed: Basic Metabolic Panel:  Recent Labs  65/78/4608/10/01 0130 07/20/13 0944  NA 138 140  K 3.7 4.0  CL 98 98  CO2 27 26  GLUCOSE 95 89  BUN 11 11  CREATININE 0.65 0.90  CALCIUM 8.9 9.8   Liver Function Tests:  Recent Labs  01/23/13 0130 07/20/13 0944  AST 16 16  ALT 12 6  ALKPHOS 39 36*  BILITOT 0.1* 0.4  PROT 6.4 6.3  ALBUMIN 3.6  --    No results found for this basename: LIPASE, AMYLASE,  in the last 8760 hours No results found for this basename: AMMONIA,  in the last 8760 hours CBC:  Recent Labs  01/23/13 0130 07/20/13 0944  WBC 6.9 5.0  NEUTROABS 3.7 2.6  HGB 14.4 15.5  HCT 40.1 44.9  MCV 93.5 95  PLT 216 269   Lipid Panel:  Recent Labs  07/20/13 0944  HDL 66  LDLCALC 86  TRIG 73  CHOLHDL 2.5     Assessment/Plan 1. Other atopic dermatitis and related conditions -exacerbation on hands and lateral ankles, in the past flucinonide has worked well - fluocinonide gel (LIDEX) 0.05 %; Apply 1 application topically 2 (two) times daily.  Dispense: 60 g; Refill: 4 not to use until after completion of antibiotic, to not use if she feels like area is infection Still with redness and slight warmth, will retreat at this time - cephALEXin (KEFLEX) 500 MG capsule; Take 1 capsule (500 mg total) by mouth 3 (three) times daily.  Dispense: 21 capsule; Refill: 0 -follow up instructions given.

## 2013-10-04 NOTE — Patient Instructions (Signed)
refill provided for keflex--  Take as directed   fluocinonide gel sent to pharmacy  Follow up if no better or worsening of symptoms

## 2013-10-23 ENCOUNTER — Emergency Department (INDEPENDENT_AMBULATORY_CARE_PROVIDER_SITE_OTHER): Payer: BC Managed Care – PPO

## 2013-10-23 ENCOUNTER — Encounter (HOSPITAL_COMMUNITY): Payer: Self-pay | Admitting: Emergency Medicine

## 2013-10-23 ENCOUNTER — Emergency Department (HOSPITAL_COMMUNITY)
Admission: EM | Admit: 2013-10-23 | Discharge: 2013-10-23 | Disposition: A | Discharge status: Home / Self Care | Payer: BC Managed Care – PPO | Source: Home / Self Care | Attending: Family Medicine | Admitting: Family Medicine

## 2013-10-23 DIAGNOSIS — J189 Pneumonia, unspecified organism: Secondary | ICD-10-CM

## 2013-10-23 DIAGNOSIS — F17200 Nicotine dependence, unspecified, uncomplicated: Secondary | ICD-10-CM

## 2013-10-23 MED ORDER — AZITHROMYCIN 250 MG PO TABS
1000.0000 mg | ORAL_TABLET | Freq: Once | ORAL | Status: AC
  Administered 2013-10-23: 1000 mg via ORAL

## 2013-10-23 MED ORDER — CEFTRIAXONE SODIUM 1 G IJ SOLR
1.0000 g | Freq: Once | INTRAMUSCULAR | Status: AC
  Administered 2013-10-23: 1 g via INTRAMUSCULAR

## 2013-10-23 MED ORDER — MOXIFLOXACIN HCL 400 MG PO TABS: 400.0000 mg | ORAL_TABLET | Freq: Every day | ORAL | Status: DC

## 2013-10-23 MED ORDER — IPRATROPIUM BROMIDE 0.06 % NA SOLN: 2.0000 | Freq: Four times a day (QID) | NASAL | Status: DC

## 2013-10-23 MED ORDER — LIDOCAINE HCL (PF) 1 % IJ SOLN
INTRAMUSCULAR | Status: AC
  Filled 2013-10-23: qty 5

## 2013-10-23 MED ORDER — AZITHROMYCIN 250 MG PO TABS
ORAL_TABLET | ORAL | Status: AC
  Filled 2013-10-23: qty 4

## 2013-10-23 MED ORDER — CEFTRIAXONE SODIUM 1 G IJ SOLR
INTRAMUSCULAR | Status: AC
  Filled 2013-10-23: qty 10

## 2013-10-23 NOTE — ED Provider Notes (Signed)
CSN: [id number]     Arrival date & time 10/23/13  1523 History   First MD Initiated Contact with Patient 10/23/13 1552     Chief Complaint  Patient presents with  . chest congestion    (Consider location/radiation/quality/duration/timing/severity/associated sxs/prior Treatment) Patient is a 38 y.o. female presenting with cough. The history is provided by the patient.  Cough Cough characteristics:  Productive Sputum characteristics:  Green and yellow Severity:  Moderate Onset quality:  Gradual Duration:  2 weeks Progression:  Unchanged Chronicity:  New Smoker: yes   Context: upper respiratory infection   Relieved by:  None tried Worsened by:  Nothing tried Ineffective treatments:  None tried Associated symptoms: chest pain, fever, rhinorrhea, shortness of breath and sinus congestion   Associated symptoms: no wheezing   Risk factors: recent infection   Risk factors comment:  Leg infection on 2 rounds of keflex per lmd.   Past Medical History  Diagnosis Date  . Allergy   . Asthma   . Hypertension   . Eczema    Past Surgical History  Procedure Laterality Date  . None    . Ovarian cyst drainage     No family history on file. History  Substance Use Topics  . Smoking status: Current Every Day Smoker -- 1.00 packs/day    Types: Cigarettes  . Smokeless tobacco: Not on file  . Alcohol Use: Yes     Comment: socially   OB History   Grav Para Term Preterm Abortions TAB SAB Ect Mult Living                 Review of Systems  Constitutional: Positive for fever.  HENT: Positive for congestion, postnasal drip and rhinorrhea.   Respiratory: Positive for cough and shortness of breath. Negative for wheezing.   Cardiovascular: Positive for chest pain. Negative for leg swelling.    Allergies  Peanuts; Shellfish allergy; Avocado; Macrolides and ketolides; Other; and Erythromycin  Home Medications   Prior to Admission medications   Medication Sig Start Date End Date Taking?  Authorizing Provider  guaiFENesin (ROBITUSSIN) 100 MG/5ML SOLN Take 5 mLs by mouth every 4 (four) hours as needed for cough or to loosen phlegm.   Yes Historical Provider, MD  albuterol (PROVENTIL HFA;VENTOLIN HFA) 108 (90 BASE) MCG/ACT inhaler Inhale 2 puffs into the lungs every 6 (six) hours as needed for wheezing. 07/20/13   Claudie ReveringJessica M Karam, NP  cephALEXin (KEFLEX) 500 MG capsule Take 1 capsule (500 mg total) by mouth 3 (three) times daily. 10/04/13   Claudie ReveringJessica M Karam, NP  cetirizine (ZYRTEC) 10 MG tablet Take 10 mg by mouth daily.    Historical Provider, MD  EPINEPHrine (EPIPEN) 0.3 mg/0.3 mL DEVI Inject 0.3 mLs (0.3 mg total) into the muscle once. 08/02/12   Claudie ReveringJessica M Karam, NP  fluocinonide gel (LIDEX) 0.05 % Apply 1 application topically 2 (two) times daily. 10/04/13   Claudie ReveringJessica M Karam, NP  Fluticasone-Salmeterol (ADVAIR) 500-50 MCG/DOSE AEPB Inhale 1 puff into the lungs every 12 (twelve) hours. 07/20/13   Claudie ReveringJessica M Karam, NP  hydrochlorothiazide (HYDRODIURIL) 25 MG tablet TAKE 1 TABLET BY MOUTH DAILY TO HELP BLOOD PRESSURE 07/20/13   Claudie ReveringJessica M Karam, NP  hydrocortisone 2.5 % ointment APPLY TO RASH DAILY 07/20/13   Claudie ReveringJessica M Karam, NP  moxifloxacin (AVELOX) 400 MG tablet Take 1 tablet (400 mg total) by mouth daily. 10/23/13   Linna HoffJames D Darrius Montano, MD  predniSONE (DELTASONE) 20 MG tablet Take 2 tablets daily for 1 week 07/20/13  Claudie ReveringJessica M Karam, NP  Skin Protectants, Misc. (EUCERIN) cream Apply 1 application topically 3 (three) times daily.    Historical Provider, MD   BP 148/99  Pulse 109  Temp(Src) 99.5 F (37.5 C) (Oral)  Resp 14  SpO2 98% Physical Exam  Nursing note and vitals reviewed. Constitutional: She is oriented to person, place, and time. She appears well-developed and well-nourished.  HENT:  Right Ear: External ear normal.  Left Ear: External ear normal.  Nose: Mucosal edema and rhinorrhea present.  Mouth/Throat: Oropharynx is clear and moist.  Neck: Normal range of motion. Neck supple.   Pulmonary/Chest: She has wheezes. She exhibits tenderness.  Lymphadenopathy:    She has no cervical adenopathy.  Neurological: She is alert and oriented to person, place, and time.  Skin: Skin is warm and dry.    ED Course  Procedures (including critical care time) Labs Review Labs Reviewed - No data to display  Imaging Review Dg Chest 2 View  10/23/2013   CLINICAL DATA:  Smoker with cough and fever.  EXAM: CHEST - 2 VIEW  COMPARISON:  08/10/2011  FINDINGS: The heart size and mediastinal contours are within normal limits. There is a probable infiltrate in the anterior aspect of the right middle lobe. There is no evidence of pulmonary edema, pneumothorax, nodule or pleural fluid.  IMPRESSION: Right middle lobe infiltrate.   Electronically Signed   By: Irish LackGlenn  Yamagata M.D.   On: 10/23/2013 16:31     MDM   1. CAP (community acquired pneumonia)   2. Current smoker        Linna HoffJames D Atlee Villers, MD 10/23/13 [phone]

## 2013-10-23 NOTE — ED Notes (Signed)
Provided saltines and ginger ale 

## 2013-10-23 NOTE — Discharge Instructions (Signed)
Take all of medicine, drink lots of fluids, no more smoking, mucinex daily, see your doctor if further problems

## 2013-10-23 NOTE — ED Notes (Signed)
Congestion, fever, pain with breathing, onset Saturday 7/4.

## 2013-11-22 ENCOUNTER — Other Ambulatory Visit: Payer: Self-pay | Admitting: Nurse Practitioner

## 2013-11-30 ENCOUNTER — Other Ambulatory Visit: Payer: Self-pay | Admitting: Nurse Practitioner

## 2014-04-02 ENCOUNTER — Encounter: Payer: Self-pay | Admitting: Nurse Practitioner

## 2014-04-02 ENCOUNTER — Ambulatory Visit (INDEPENDENT_AMBULATORY_CARE_PROVIDER_SITE_OTHER): Payer: No Typology Code available for payment source | Admitting: Nurse Practitioner

## 2014-04-02 VITALS — BP 130/88 | HR 67 | Temp 97.4°F | Resp 10 | Ht 64.0 in | Wt 115.0 lb

## 2014-04-02 DIAGNOSIS — J4520 Mild intermittent asthma, uncomplicated: Secondary | ICD-10-CM

## 2014-04-02 DIAGNOSIS — L309 Dermatitis, unspecified: Secondary | ICD-10-CM

## 2014-04-02 DIAGNOSIS — I10 Essential (primary) hypertension: Secondary | ICD-10-CM

## 2014-04-02 MED ORDER — PREDNISONE 20 MG PO TABS: ORAL_TABLET | ORAL | Status: DC

## 2014-04-02 MED ORDER — FLUOCINONIDE 0.05 % EX GEL: 1.0000 "application " | Freq: Two times a day (BID) | CUTANEOUS | Status: DC

## 2014-04-02 MED ORDER — BUDESONIDE-FORMOTEROL FUMARATE 160-4.5 MCG/ACT IN AERO: 2.0000 | INHALATION_SPRAY | Freq: Two times a day (BID) | RESPIRATORY_TRACT | Status: DC

## 2014-04-02 MED ORDER — HYDROCHLOROTHIAZIDE 25 MG PO TABS
ORAL_TABLET | ORAL | Status: DC
Start: 2014-04-02 — End: 2014-10-05

## 2014-04-02 MED ORDER — ALBUTEROL SULFATE HFA 108 (90 BASE) MCG/ACT IN AERS: 2.0000 | INHALATION_SPRAY | Freq: Four times a day (QID) | RESPIRATORY_TRACT | Status: DC | PRN

## 2014-04-02 MED ORDER — HYDROCORTISONE 2.5 % EX OINT: TOPICAL_OINTMENT | CUTANEOUS | Status: DC

## 2014-04-02 NOTE — Patient Instructions (Signed)
Follow up in 6 months for routine follow up, will get labs at this visit

## 2014-04-02 NOTE — Progress Notes (Signed)
Patient ID: Cindy Huynh, female   DOB: November 15, 1975, 38 y.o.   MRN: [id number]    PCP: Kimber Relic, MD  Allergies  Allergen Reactions  . Peanuts [Peanut Oil] Anaphylaxis    All Nuts cause same reaction  . Shellfish Allergy Anaphylaxis, Itching and Rash  . Avocado Nausea And Vomiting  . Other Itching and Rash    All red berries, tomatoes    Chief Complaint  Patient presents with  . Medication Management    Discuss medications. Advair is no longer covered, alternatives include Dulera or Symbicort      HPI: Patient is a 38 y.o. female seen in the office today for medication management. Changed insurances (was laid off work, got a new job) and they are not covering advair, will coverage Symbicort.  Does not normally need her albuterol, but dating a guy who has dogs and needs it more when she is over there. From day to day normally rarely will use it.    Would like refills on medications for eczema. Last flare several months ago. Uses Prednisone as needed for these flares or special events, typically uses prednisone 3 times a year. Generally only needs half a week and then will taper.   Review of Systems:  Review of Systems  Constitutional: Negative for activity change, appetite change, fatigue and unexpected weight change.  HENT: Negative for congestion and hearing loss.   Eyes: Negative.   Respiratory: Negative for cough and shortness of breath.   Cardiovascular: Negative for chest pain, palpitations and leg swelling.  Gastrointestinal: Negative for abdominal pain, diarrhea and constipation.  Genitourinary: Negative for dysuria and difficulty urinating.  Musculoskeletal: Negative for myalgias and arthralgias.  Skin: Negative for color change and wound.  Neurological: Negative for dizziness and weakness.  Psychiatric/Behavioral: Negative for behavioral problems, confusion and agitation.    Past Medical History  Diagnosis Date  . Allergy   . Asthma   . Hypertension   .  Eczema    Past Surgical History  Procedure Laterality Date  . None    . Ovarian cyst drainage     Social History:   reports that she has been smoking Cigarettes.  She has been smoking about 1.00 pack per day. She does not have any smokeless tobacco history on file. She reports that she drinks alcohol. She reports that she does not use illicit drugs.  History reviewed. No pertinent family history.  Medications: Patient's Medications  New Prescriptions   No medications on file  Previous Medications   ADVAIR DISKUS 500-50 MCG/DOSE AEPB    INHALE 1 PUFF BY MOUTH EVERY 12 HOURS   ALBUTEROL (PROVENTIL HFA;VENTOLIN HFA) 108 (90 BASE) MCG/ACT INHALER    Inhale 2 puffs into the lungs every 6 (six) hours as needed for wheezing.   CETIRIZINE (ZYRTEC) 10 MG TABLET    Take 10 mg by mouth daily.   EPINEPHRINE (EPIPEN) 0.3 MG/0.3 ML DEVI    Inject 0.3 mLs (0.3 mg total) into the muscle once.   FLUOCINONIDE GEL (LIDEX) 0.05 %    Apply 1 application topically 2 (two) times daily.   HYDROCHLOROTHIAZIDE (HYDRODIURIL) 25 MG TABLET    TAKE 1 TABLET BY MOUTH DAILY TO HELP BLOOD PRESSURE   HYDROCORTISONE 2.5 % OINTMENT    APPLY TO RASH DAILY   PREDNISONE (DELTASONE) 20 MG TABLET    Take 2 tablets daily for 1 week   SKIN PROTECTANTS, MISC. (EUCERIN) CREAM    Apply 1 application topically 3 (three) times  daily.  Modified Medications   No medications on file  Discontinued Medications   CEPHALEXIN (KEFLEX) 500 MG CAPSULE    Take 1 capsule (500 mg total) by mouth 3 (three) times daily.   GUAIFENESIN (ROBITUSSIN) 100 MG/5ML SOLN    Take 5 mLs by mouth every 4 (four) hours as needed for cough or to loosen phlegm.   HYDROCORTISONE 2.5 % OINTMENT    APPLY TO RASH DAILY   IPRATROPIUM (ATROVENT) 0.06 % NASAL SPRAY    Place 2 sprays into both nostrils 4 (four) times daily.   MOXIFLOXACIN (AVELOX) 400 MG TABLET    Take 1 tablet (400 mg total) by mouth daily.     Physical Exam:  Filed Vitals:   04/02/14 1441    BP: 130/88  Pulse: 67  Temp: 97.4 F (36.3 C)  TempSrc: Oral  Resp: 10  Height: 5\' 4"  (1.626 m)  Weight: 115 lb (52.164 kg)  SpO2: 98%    Physical Exam  Constitutional: She is oriented to person, place, and time. She appears well-developed and well-nourished. No distress.  HENT:  Head: Normocephalic and atraumatic.  Mouth/Throat: Oropharynx is clear and moist. No oropharyngeal exudate.  Eyes: Conjunctivae and EOM are normal. Pupils are equal, round, and reactive to light.  Neck: Normal range of motion. Neck supple.  Cardiovascular: Normal rate, regular rhythm, normal heart sounds and intact distal pulses.   Pulmonary/Chest: Effort normal and breath sounds normal.  Abdominal: Soft. Bowel sounds are normal.  Musculoskeletal: Normal range of motion. She exhibits no edema.  Neurological: She is alert and oriented to person, place, and time. She has normal reflexes. No cranial nerve deficit.  Skin: Skin is warm and dry. She is not diaphoretic.  Psychiatric: She has a normal mood and affect.    Labs reviewed: Basic Metabolic Panel:  Recent Labs  04/54/902/06/04 0944  NA 140  K 4.0  CL 98  CO2 26  GLUCOSE 89  BUN 11  CREATININE 0.90  CALCIUM 9.8   Liver Function Tests:  Recent Labs  07/20/13 0944  AST 16  ALT 6  ALKPHOS 36*  BILITOT 0.4  PROT 6.3   No results for input(s): LIPASE, AMYLASE in the last 8760 hours. No results for input(s): AMMONIA in the last 8760 hours. CBC:  Recent Labs  07/20/13 0944  WBC 5.0  NEUTROABS 2.6  HGB 15.5  HCT 44.9  MCV 95  PLT 269   Lipid Panel:  Recent Labs  07/20/13 0944  HDL 66  LDLCALC 86  TRIG 73  CHOLHDL 2.5   TSH: No results for input(s): TSH in the last 8760 hours. A1C: No results found for: HGBA1C   Assessment/Plan  1. Asthma, mild intermittent, uncomplicated -needing medication change due to insurance. Will stop Advair and start Symbicort.  - budesonide-formoterol (SYMBICORT) 160-4.5 MCG/ACT inhaler;  Inhale 2 puffs into the lungs 2 (two) times daily.  Dispense: 1 Inhaler; Refill: 4 - albuterol (PROVENTIL HFA;VENTOLIN HFA) 108 (90 BASE) MCG/ACT inhaler; Inhale 2 puffs into the lungs every 6 (six) hours as needed for wheezing. Pt prefers ventolin  Dispense: 3.7 g; Refill: 3 - to follow up sooner or call if Symbicort not effective or needing albuterol more often  2. Essential hypertension -controlled on HCTZ - hydrochlorothiazide (HYDRODIURIL) 25 MG tablet; TAKE 1 TABLET BY MOUTH DAILY TO HELP BLOOD PRESSURE  Dispense: 90 tablet; Refill: 1  3. Eczema -stable at this time. Using creams as prescribed  - fluocinonide gel (LIDEX) 0.05 %; Apply 1  application topically 2 (two) times daily.  Dispense: 60 g; Refill: 3 - hydrocortisone 2.5 % ointment; APPLY TO RASH DAILY  Dispense: 858 g; Refill: 4 - predniSONE (DELTASONE) 20 MG tablet; Take 2 tablets daily for 1 week  Dispense: 14 tablet; Refill: 1  Follow up in 6 months for routine follow up or sooner if needed

## 2014-10-05 ENCOUNTER — Encounter: Payer: Self-pay | Admitting: Internal Medicine

## 2014-10-05 ENCOUNTER — Ambulatory Visit (INDEPENDENT_AMBULATORY_CARE_PROVIDER_SITE_OTHER): Payer: No Typology Code available for payment source | Admitting: Internal Medicine

## 2014-10-05 VITALS — BP 116/76 | HR 70 | Temp 98.0°F | Resp 18 | Ht 64.0 in | Wt 116.0 lb

## 2014-10-05 DIAGNOSIS — I10 Essential (primary) hypertension: Secondary | ICD-10-CM | POA: Diagnosis not present

## 2014-10-05 DIAGNOSIS — J4520 Mild intermittent asthma, uncomplicated: Secondary | ICD-10-CM

## 2014-10-05 DIAGNOSIS — Z720 Tobacco use: Secondary | ICD-10-CM | POA: Diagnosis not present

## 2014-10-05 DIAGNOSIS — L309 Dermatitis, unspecified: Secondary | ICD-10-CM

## 2014-10-05 DIAGNOSIS — L709 Acne, unspecified: Secondary | ICD-10-CM | POA: Diagnosis not present

## 2014-10-05 DIAGNOSIS — M25562 Pain in left knee: Secondary | ICD-10-CM

## 2014-10-05 DIAGNOSIS — F17200 Nicotine dependence, unspecified, uncomplicated: Secondary | ICD-10-CM

## 2014-10-05 MED ORDER — HYDROCORTISONE 2.5 % EX OINT: TOPICAL_OINTMENT | CUTANEOUS | Status: DC

## 2014-10-05 MED ORDER — BUDESONIDE-FORMOTEROL FUMARATE 160-4.5 MCG/ACT IN AERO: 2.0000 | INHALATION_SPRAY | Freq: Two times a day (BID) | RESPIRATORY_TRACT | Status: DC

## 2014-10-05 MED ORDER — ALBUTEROL SULFATE HFA 108 (90 BASE) MCG/ACT IN AERS: 2.0000 | INHALATION_SPRAY | Freq: Four times a day (QID) | RESPIRATORY_TRACT | Status: DC | PRN

## 2014-10-05 MED ORDER — PREDNISONE 20 MG PO TABS: ORAL_TABLET | ORAL | Status: DC

## 2014-10-05 MED ORDER — HYDROCHLOROTHIAZIDE 25 MG PO TABS: ORAL_TABLET | ORAL | Status: DC

## 2014-10-05 NOTE — Patient Instructions (Signed)
Continue current medications as ordered  Recommend smoking cessation  Follow up in 6 mos for CPE.

## 2014-10-05 NOTE — Progress Notes (Signed)
Patient ID: Cindy Huynh, female   DOB: [birth year], 39 y.o.   MRN: [id number]    Location:    [facility]   Place of Service:   OFFICE  Chief Complaint  Patient presents with  . Medical Management of Chronic Issues    6 month follow-up    HPI:  39 yo female seen today for f/u. She is c/a increased facial bumps that improve with application of witch hazel. Occasional discomfort. No bleeding but bumps occasionally drain.  She also c/o 1 week hx left anterior knee pain and lump. 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 No known injury/trauma. No f/c. No insect bites  Asthma stable on symbicort. No exac since last OV. She is still smoking 1 ppd since age 73. She is not interested in smoking cessation at this time.  Past Medical History  Diagnosis Date  . Allergy   . Asthma   . Hypertension   . Eczema     Past Surgical History  Procedure Laterality Date  . None    . Ovarian cyst drainage      Patient Care Team: Kimber Relic, MD as PCP - General (Internal Medicine)  History   Social History  . Marital Status: Married    Spouse Name: N/A  . Number of Children: N/A  . Years of Education: N/A   Occupational History  . Not on file.   Social History Main Topics  . Smoking status: Current Every Day Smoker -- 1.00 packs/day    Types: Cigarettes  . Smokeless tobacco: Not on file  . Alcohol Use: Yes     Comment: socially  . Drug Use: No  . Sexual Activity: Yes    Birth Control/ Protection: IUD   Other Topics Concern  . Not on file   Social History Narrative     reports that she has been smoking Cigarettes.  She has been smoking about 1.00 pack per day. She does not have any smokeless tobacco history on file. She reports that she drinks alcohol. She reports that she does not use illicit drugs.  Allergies  Allergen Reactions  . Peanuts [Peanut Oil] Anaphylaxis    All Nuts cause same reaction  . Shellfish Allergy Anaphylaxis, Itching and Rash  . Avocado  Nausea And Vomiting  . Other Itching and Rash    All red berries, tomatoes    Medications: Patient's Medications  New Prescriptions   No medications on file  Previous Medications   ALBUTEROL (PROVENTIL HFA;VENTOLIN HFA) 108 (90 BASE) MCG/ACT INHALER    Inhale 2 puffs into the lungs every 6 (six) hours as needed for wheezing. Pt prefers ventolin   BUDESONIDE-FORMOTEROL (SYMBICORT) 160-4.5 MCG/ACT INHALER    Inhale 2 puffs into the lungs 2 (two) times daily.   CETIRIZINE (ZYRTEC) 10 MG TABLET    Take 10 mg by mouth daily.   EPINEPHRINE (EPIPEN) 0.3 MG/0.3 ML DEVI    Inject 0.3 mLs (0.3 mg total) into the muscle once.   FLUOCINONIDE GEL (LIDEX) 0.05 %    Apply 1 application topically 2 (two) times daily.   HYDROCHLOROTHIAZIDE (HYDRODIURIL) 25 MG TABLET    TAKE 1 TABLET BY MOUTH DAILY TO HELP BLOOD PRESSURE   HYDROCORTISONE 2.5 % OINTMENT    APPLY TO RASH DAILY   PREDNISONE (DELTASONE) 20 MG TABLET    Take 2 tablets daily for 1 week   SKIN PROTECTANTS, MISC. (EUCERIN) CREAM    Apply 1 application topically 3 (three) times daily.  Modified Medications   No medications on file  Discontinued Medications   No medications on file    Review of Systems  Constitutional: Negative for fever, chills, diaphoresis, activity change, appetite change and fatigue.  HENT: Negative for ear pain and sore throat.   Eyes: Negative for visual disturbance.  Respiratory: Negative for cough, chest tightness and shortness of breath.   Cardiovascular: Negative for chest pain, palpitations and leg swelling.  Gastrointestinal: Negative for nausea, vomiting, abdominal pain, diarrhea, constipation and blood in stool.  Genitourinary: Negative for dysuria.  Musculoskeletal: Positive for joint swelling. Negative for arthralgias.  Skin: Positive for rash.  Neurological: Negative for dizziness, tremors, numbness and headaches.  Psychiatric/Behavioral: Negative for sleep disturbance. The patient is not nervous/anxious.       Filed Vitals:   10/05/14 1521  BP: 116/76  Pulse: 70  Temp: 98 F (36.7 C)  TempSrc: Oral  Resp: 18  Height: 5\' 4"  (1.626 m)  Weight: 116 lb (52.617 kg)  SpO2: 97%   Body mass index is 19.9 kg/(m^2).  Physical Exam  Constitutional: She is oriented to person, place, and time. She appears well-developed and well-nourished.  HENT:  Head:    Mouth/Throat: Oropharynx is clear and moist. No oropharyngeal exudate.  Eyes: Pupils are equal, round, and reactive to light. No scleral icterus.  Neck: Neck supple. Carotid bruit is not present. No tracheal deviation present. No thyromegaly present.  Cardiovascular: Normal rate, regular rhythm, normal heart sounds and intact distal pulses.  Exam reveals no gallop and no friction rub.   No murmur heard. No LE edema b/l. no calf TTP.   Pulmonary/Chest: Effort normal and breath sounds normal. No stridor. No respiratory distress. She has no wheezes. She has no rales.  Abdominal: Soft. Bowel sounds are normal. She exhibits no distension and no mass. There is no tenderness. There is no rebound and no guarding.  Musculoskeletal: She exhibits edema and tenderness.       Left knee: She exhibits decreased range of motion and swelling. She exhibits no effusion, no deformity, no erythema, normal alignment, no LCL laxity, normal patellar mobility, no bony tenderness, normal meniscus and no MCL laxity. Tenderness found. No medial joint line, no lateral joint line, no MCL, no LCL and no patellar tendon tenderness noted.  TTP at swelling at left tibial plateau. No redness or increased warmth. FROM at knee joint. Neg drawer test.   Lymphadenopathy:    She has no cervical adenopathy.  Neurological: She is alert and oriented to person, place, and time. She has normal reflexes.  Skin: Skin is warm and dry. Rash (eczemaous rash, generalized. no vesicles or secondary signs of infection. skin hyperpigmentation noted) noted.  Psychiatric: She has a normal mood and  affect. Her behavior is normal. Judgment and thought content normal.     Labs reviewed: No visits with results within 3 Month(s) from this visit. Latest known visit with results is:  Office Visit on 07/20/2013  Component Date Value Ref Range Status  . WBC 07/20/2013 5.0  3.4 - 10.8 x10E3/uL Final  . RBC 07/20/2013 4.73  3.77 - 5.28 x10E6/uL Final  . Hemoglobin 07/20/2013 15.5  11.1 - 15.9 g/dL Final  . HCT 16/01/9603 44.9  34.0 - 46.6 % Final  . MCV 07/20/2013 95  79 - 97 fL Final  . MCH 07/20/2013 32.8  26.6 - 33.0 pg Final  . MCHC 07/20/2013 34.5  31.5 - 35.7 g/dL Final  . RDW 54/12/8117 13.4  12.3 - 15.4 %  Final  . Platelets 07/20/2013 269  150 - 379 x10E3/uL Final  . Neutrophils Relative % 07/20/2013 51   Final  . Lymphs 07/20/2013 35   Final  . Monocytes 07/20/2013 7   Final  . Eos 07/20/2013 6   Final  . Basos 07/20/2013 1   Final  . Neutrophils Absolute 07/20/2013 2.6  1.4 - 7.0 x10E3/uL Final  . Lymphocytes Absolute 07/20/2013 1.7  0.7 - 3.1 x10E3/uL Final  . Monocytes Absolute 07/20/2013 0.3  0.1 - 0.9 x10E3/uL Final  . Eosinophils Absolute 07/20/2013 0.3  0.0 - 0.4 x10E3/uL Final  . Basophils Absolute 07/20/2013 0.0  0.0 - 0.2 x10E3/uL Final  . Immature Granulocytes 07/20/2013 0   Final  . Immature Grans (Abs) 07/20/2013 0.0  0.0 - 0.1 x10E3/uL Final  . Cholesterol, Total 07/20/2013 167  100 - 199 mg/dL Final  . Triglycerides 07/20/2013 73  0 - 149 mg/dL Final  . HDL 69/48/5462 66  >39 mg/dL Final   Comment: According to ATP-III Guidelines, HDL-C >59 mg/dL is considered a                          negative risk factor for CHD.  Marland Kitchen VLDL Cholesterol Cal 07/20/2013 15  5 - 40 mg/dL Final  . LDL Calculated 07/20/2013 86  0 - 99 mg/dL Final  . Chol/HDL Ratio 07/20/2013 2.5  0.0 - 4.4 ratio units Final   Comment:                                   T. Chol/HDL Ratio                                                                      Men  Women                                                         1/2 Avg.Risk  3.4    3.3                                                            Avg.Risk  5.0    4.4                                                         2X Avg.Risk  9.6    7.1  3X Avg.Risk 23.4   11.0  . Glucose 07/20/2013 89  65 - 99 mg/dL Final  . BUN 09/81/1914 11  6 - 20 mg/dL Final  . Creatinine, Ser 07/20/2013 0.90  0.57 - 1.00 mg/dL Final  . GFR calc non Af Amer 07/20/2013 81  >59 mL/min/1.73 Final  . GFR calc Af Amer 07/20/2013 94  >59 mL/min/1.73 Final  . BUN/Creatinine Ratio 07/20/2013 12  8 - 20 Final  . Sodium 07/20/2013 140  134 - 144 mmol/L Final  . Potassium 07/20/2013 4.0  3.5 - 5.2 mmol/L Final  . Chloride 07/20/2013 98  97 - 108 mmol/L Final  . CO2 07/20/2013 26  18 - 29 mmol/L Final  . Calcium 07/20/2013 9.8  8.7 - 10.2 mg/dL Final  . Total Protein 07/20/2013 6.3  6.0 - 8.5 g/dL Final  . Albumin 78/29/5621 4.6  3.5 - 5.5 g/dL Final  . Globulin, Total 07/20/2013 1.7  1.5 - 4.5 g/dL Final  . Albumin/Globulin Ratio 07/20/2013 2.7* 1.1 - 2.5 Final  . Total Bilirubin 07/20/2013 0.4  0.0 - 1.2 mg/dL Final  . Alkaline Phosphatase 07/20/2013 36* 39 - 117 IU/L Final  . AST 07/20/2013 16  0 - 40 IU/L Final  . ALT 07/20/2013 6  0 - 32 IU/L Final    No results found.   Assessment/Plan   ICD-9-CM ICD-10-CM   1. Left anterior knee pain - probably due to contusion  719.46 M25.562   2. Adult acne - cont witch hazel prn application 706.1 L70.9   3. Asthma, mild intermittent, uncomplicated - stable 493.90 J45.20 budesonide-formoterol (SYMBICORT) 160-4.5 MCG/ACT inhaler     albuterol (PROVENTIL HFA;VENTOLIN HFA) 108 (90 BASE) MCG/ACT inhaler  4. Essential hypertension - stable 401.9 I10 hydrochlorothiazide (HYDRODIURIL) 25 MG tablet  5. Eczema - stable 692.9 L30.9 predniSONE (DELTASONE) 20 MG tablet     hydrocortisone 2.5 % ointment  6. Tobacco use disorder - ongoing 305.1 Z72.0    --Continue  current medications as ordered. RF provided today  --smoking cessation discussed and highly urged  --Follow up in 6 mos for CPE with Shanda Bumps.  Kalis Friese S. Ancil Linsey  [facility] [address] [place], [place] [address] [phone] Cell (Monday-Friday 8 AM - 5 PM) [phone] After 5 PM and follow prompts

## 2014-10-07 DIAGNOSIS — L309 Dermatitis, unspecified: Secondary | ICD-10-CM | POA: Insufficient documentation

## 2015-04-01 IMAGING — US US PELVIS COMPLETE
1 series · 13 of 25 positions shown · non-contrast
Comparison: None

CLINICAL DATA: Pelvic pain.

TRANSABDOMINAL AND TRANSVAGINAL ULTRASOUND OF PELVIS
TECHNIQUE: Both transabdominal and transvaginal ultrasound
examinations of the pelvis were performed. Transabdominal technique
was performed for global imaging of the pelvis including uterus,
ovaries, adnexal regions, and pelvic cul-de-sac.
It was necessary to proceed with endovaginal exam following the
transabdominal exam to visualize the uterus and ovaries in greater
detail.

[Series 1: us pelvis complete · 0.20mm/px · 13 of 87 slices shown]
[im 1/87]
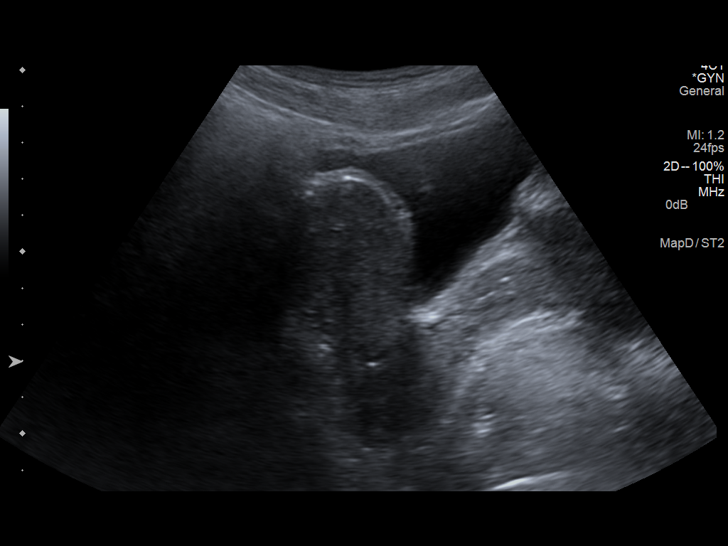
[im 8/87]
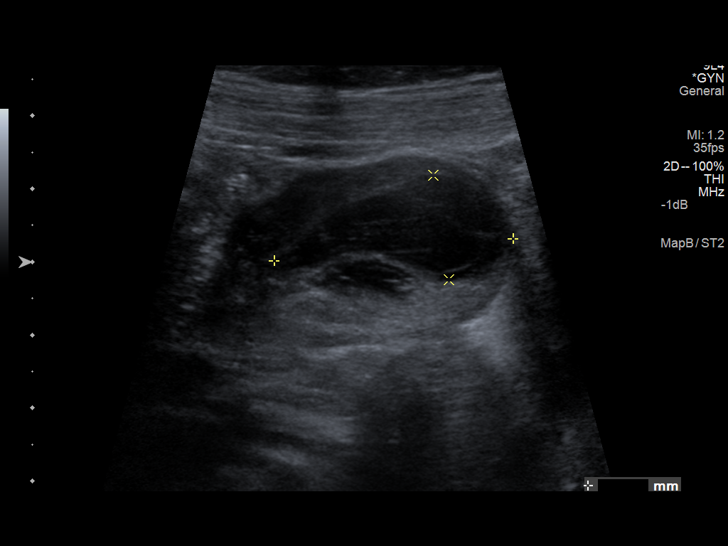
[im 15/87]
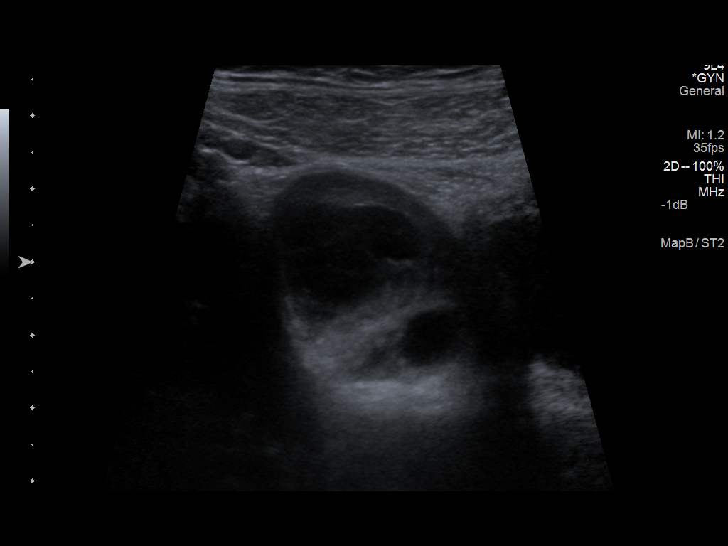
[im 22/87]
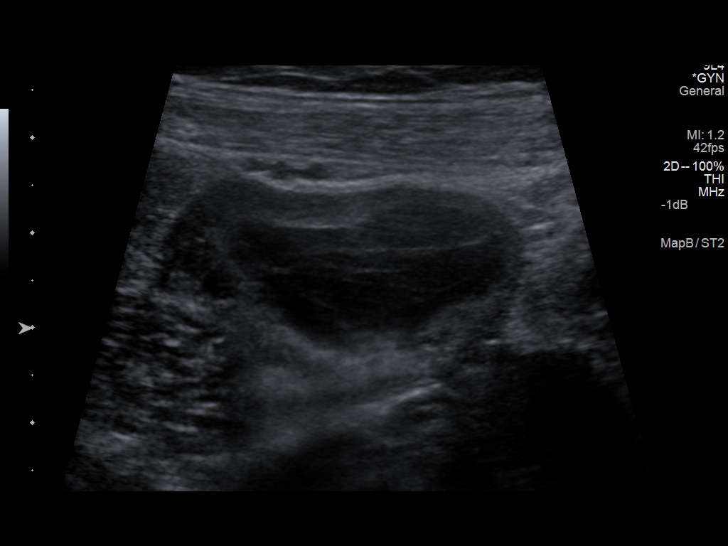
[im 29/87]
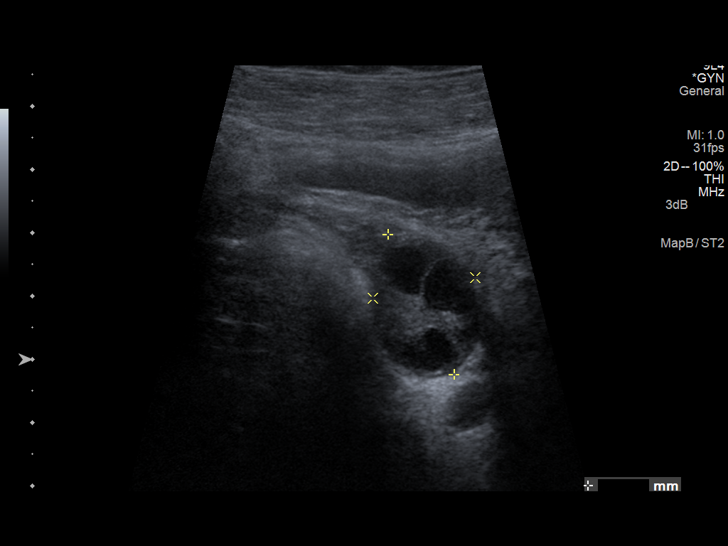
[im 36/87]
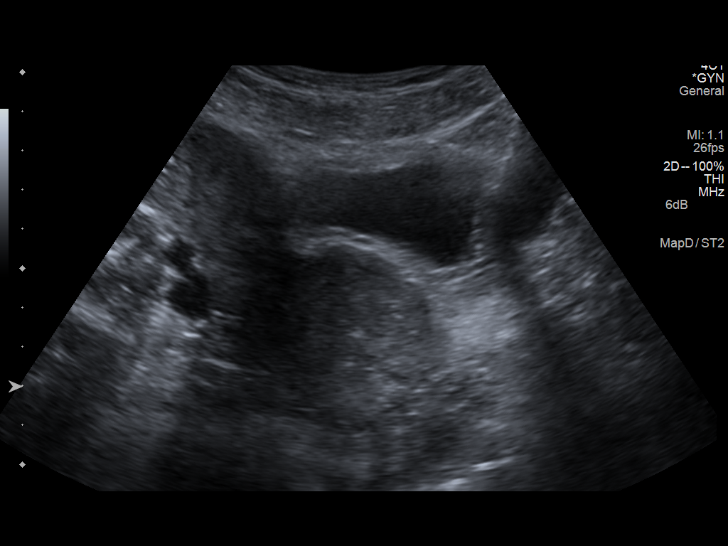
[im 44/87]
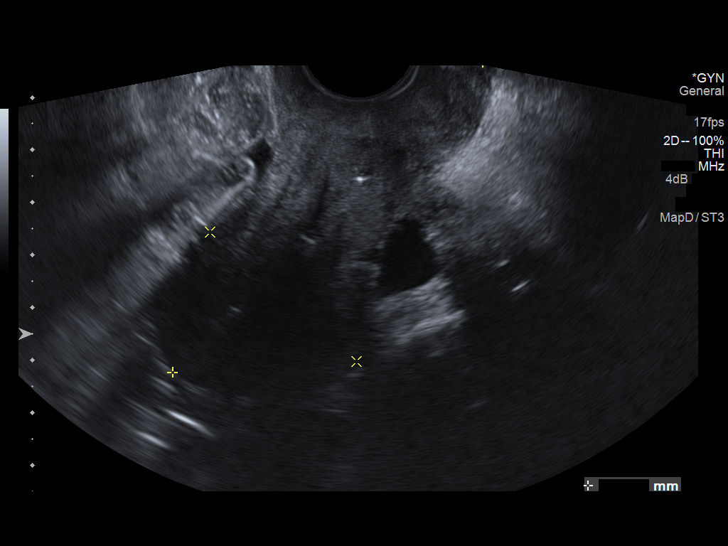
[im 51/87]
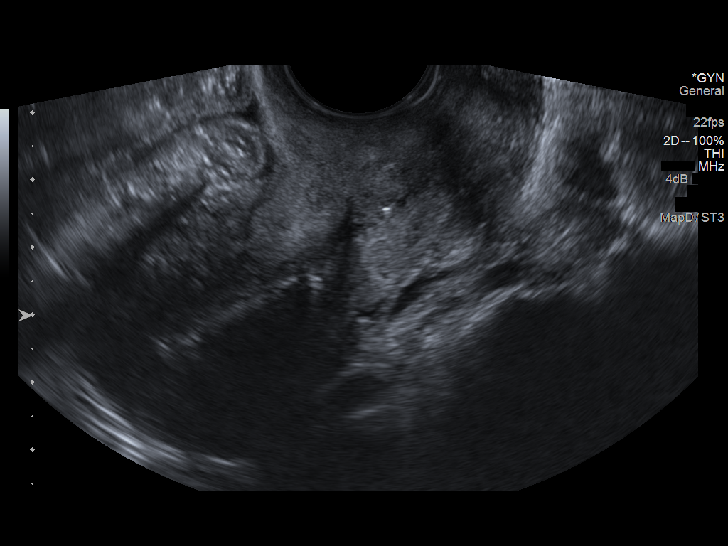
[im 58/87]
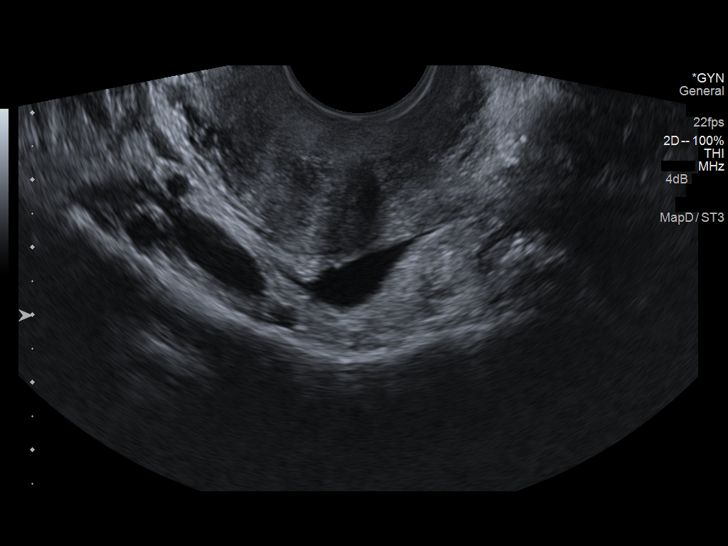
[im 65/87]
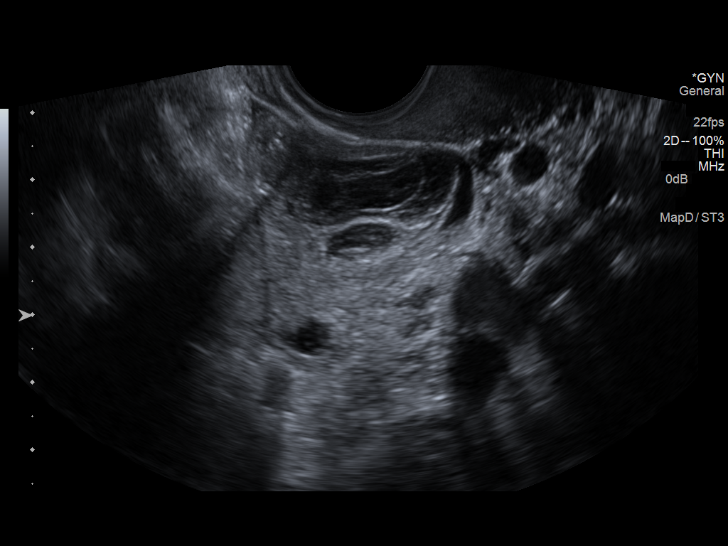
[im 72/87]
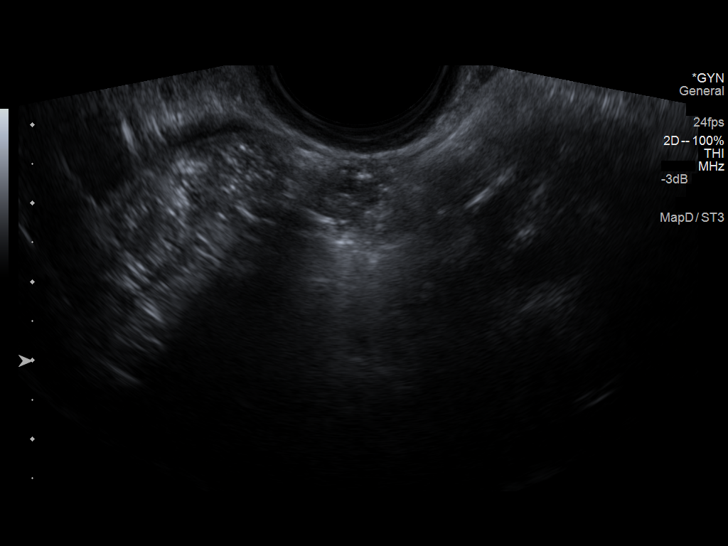
[im 79/87]
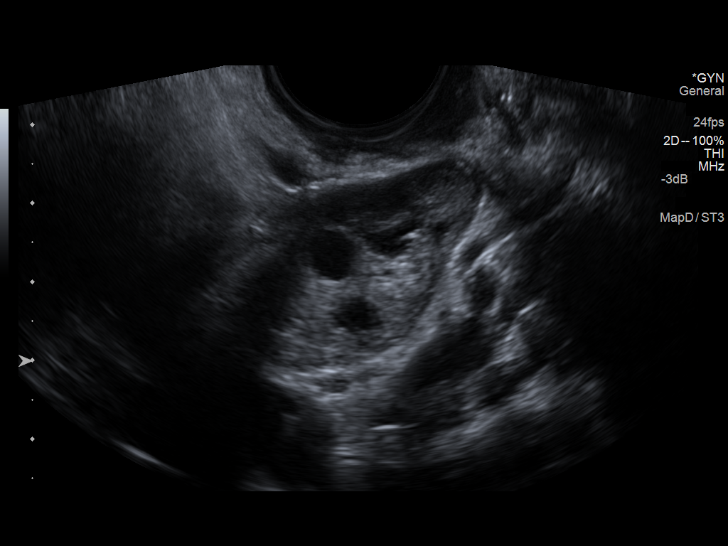
[im 87/87]
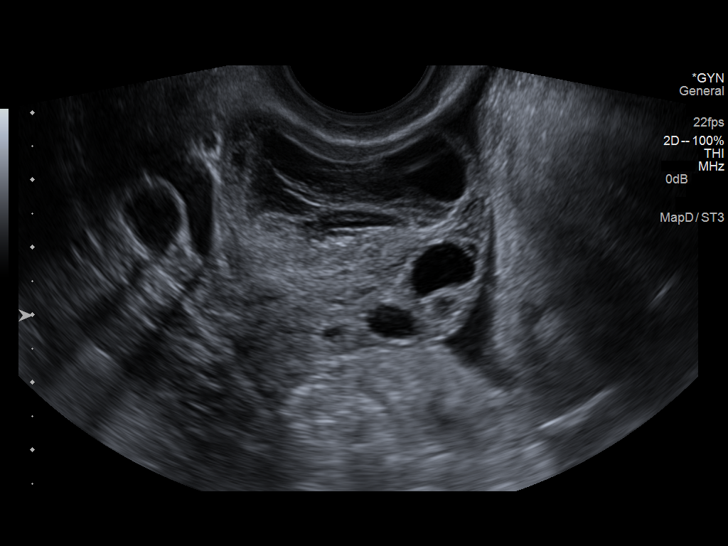

[13 of 25 positions shown; findings below may reference images not displayed]

FINDINGS: Uterus: Normal in size and appearance; measures 8.3 x 3.8 x 4.9 cm.

Endometrium: Not well characterized due to an intrauterine device,
which is noted in expected position at the fundus of the uterus.

Right ovary:  Measures 4.4 x 3.2 x 3.9 cm.  A relatively complex
3.3 x 2.8 x 1.6 cm cyst is noted within the right ovary, with lace-
like internal structure, raising question for a hemorrhagic cyst.

Left ovary: Normal appearance/no adnexal mass; measures 3.7 x 2.2 x
2.4 cm.

Limited Doppler evaluation demonstrates normal color Doppler blood
flow with respect to both ovaries; there is no evidence for ovarian
torsion.

Other findings: A small amount of free fluid is noted at the right
adnexa.
IMPRESSION: 1.  Relatively complex 3.3 cm right ovarian cyst, with lace-like
internal septations, raising concern for a hemorrhagic cyst.
2.  Intrauterine device noted in expected position at the fundus of
the uterus.
3.  Small amount of free fluid at the right adnexa is likely
physiologic in nature.

## 2015-05-02 ENCOUNTER — Other Ambulatory Visit: Payer: Self-pay | Admitting: Internal Medicine

## 2015-05-23 ENCOUNTER — Emergency Department (HOSPITAL_COMMUNITY)
Admission: EM | Admit: 2015-05-23 | Discharge: 2015-05-23 | Disposition: A | Discharge status: Home / Self Care | Payer: 59 | Source: Home / Self Care | Attending: Family Medicine | Admitting: Family Medicine

## 2015-05-23 ENCOUNTER — Encounter (HOSPITAL_COMMUNITY): Payer: Self-pay | Admitting: Emergency Medicine

## 2015-05-23 DIAGNOSIS — L03116 Cellulitis of left lower limb: Secondary | ICD-10-CM

## 2015-05-23 DIAGNOSIS — L309 Dermatitis, unspecified: Secondary | ICD-10-CM

## 2015-05-23 MED ORDER — DOXYCYCLINE HYCLATE 100 MG PO TABS
100.0000 mg | ORAL_TABLET | Freq: Two times a day (BID) | ORAL | Status: DC
Start: 2015-05-23 — End: 2015-06-11

## 2015-05-23 NOTE — ED Provider Notes (Signed)
CSN: [id number]     Arrival date & time 05/23/15  1516 History   First MD Initiated Contact with Patient 05/23/15 1639     Chief Complaint  Patient presents with  . Knee Pain   (Consider location/radiation/quality/duration/timing/severity/associated sxs/prior Treatment) HPI  Cindy Huynh is a 40 y.o. female with a history of eczema, hyperactive IgE syndrome, and history of lower extremity cellulitis presenting with anterior knee pain and redness.   She gradually developed anterior knee pain yesterday morning that worsened throughout the day to severe pain when sleeping last night. It is nonradiating and constant, and now has become red and warm. Pain is worse with stairs and better with advil. No trauma or abrasion. Not working on her knees at all. She recently went to a formal ball 4 days ago, and took prednisone for 3 days prior to that to clear up her skin and shaved her legs. She denies fevers, chills, joint swelling.   Past Medical History  Diagnosis Date  . Allergy   . Asthma   . Hypertension   . Eczema    Past Surgical History  Procedure Laterality Date  . None    . Ovarian cyst drainage     History reviewed. No pertinent family history. Social History  Substance Use Topics  . Smoking status: Current Every Day Smoker -- 1.00 packs/day    Types: Cigarettes  . Smokeless tobacco: None  . Alcohol Use: Yes     Comment: socially   OB History    No data available     Review of Systems As above - Daily smoker  Allergies  Peanuts; Shellfish allergy; Avocado; and Other  Home Medications   Prior to Admission medications   Medication Sig Start Date End Date Taking? Authorizing Provider  budesonide-formoterol (SYMBICORT) 160-4.5 MCG/ACT inhaler Inhale 2 puffs into the lungs 2 (two) times daily. 10/05/14  Yes Kirt Boys, DO  cetirizine (ZYRTEC) 10 MG tablet Take 10 mg by mouth daily.   Yes Historical Provider, MD  hydrochlorothiazide (HYDRODIURIL) 25 MG tablet TAKE 1  TABLET EVERY DAY BLOOD PRESSURE 05/02/15  Yes Sharon Seller, NP  hydrocortisone 2.5 % ointment APPLY TO RASH DAILY 10/05/14  Yes Kirt Boys, DO  albuterol (PROVENTIL HFA;VENTOLIN HFA) 108 (90 BASE) MCG/ACT inhaler Inhale 2 puffs into the lungs every 6 (six) hours as needed for wheezing. Pt prefers ventolin 10/05/14   Kirt Boys, DO  doxycycline (VIBRA-TABS) 100 MG tablet Take 1 tablet (100 mg total) by mouth 2 (two) times daily. 05/23/15   Tyrone Nine, MD  EPINEPHrine (EPIPEN) 0.3 mg/0.3 mL DEVI Inject 0.3 mLs (0.3 mg total) into the muscle once. 08/02/12   Sharon Seller, NP  fluocinonide gel (LIDEX) 0.05 % Apply 1 application topically 2 (two) times daily. 04/02/14   Sharon Seller, NP  predniSONE (DELTASONE) 20 MG tablet Take 2 tablets daily for 1 week 10/05/14   Kirt Boys, DO  Skin Protectants, Misc. (EUCERIN) cream Apply 1 application topically 3 (three) times daily.    Historical Provider, MD   Meds Ordered and Administered this Visit  Medications - No data to display  BP 120/86 mmHg  Pulse 61  Temp(Src) 97.4 F (36.3 C) (Oral)  Resp 16  SpO2 98% No data found.   Physical Exam  Constitutional: She appears well-developed and well-nourished. No distress.  Musculoskeletal:  left knee with tender, irregular annular erythema overlaying anterior patella without fluctuance. No effusion. No joint line tenderness, ligamentous laxity. Non ballotable patella.  No pain with ROM. Gait normal.   Right knee: normal  Skin: She is not diaphoretic.    ED Course  Procedures (including critical care time)  Labs Review Labs Reviewed - No data to display  Imaging Review No results found.  MDM   1. Cellulitis of left lower extremity   2. Eczema    Presumptively SSTI vs. bursitis or intrarticular pathology. Do not suspect septic arthritis in this well appearing ambulatory patient with no appreciable effusion. Predisposing factors include severe eczema, recent steroid burst, and  leg shaving. No injury or trauma.  - Rx doxycycline  BID x 7 days and close follow up with PCP on Monday to reassess. Pt has IUD. - Continue NSAIDs prn, elevation and cold compresses.  Tyrone Nine, MD 05/23/15 [phone]

## 2015-05-23 NOTE — Discharge Instructions (Signed)
sYou have an infection of your skin that requires treatment with antibiotics. You should begin to see improvement over the next 48 hours. If no better or worse, you should return for care. Schedule an appointment with Dr. Chilton Si to recheck the area on Monday. Continue taking advil as needed.  Cellulitis Cellulitis is an infection of the skin and the tissue beneath it. The infected area is usually red and tender. Cellulitis occurs most often in the arms and lower legs.  CAUSES  Cellulitis is caused by bacteria that enter the skin through cracks or cuts in the skin. The most common types of bacteria that cause cellulitis are staphylococci and streptococci. SIGNS AND SYMPTOMS   Redness and warmth.  Swelling.  Tenderness or pain.  Fever. DIAGNOSIS  Your health care provider can usually determine what is wrong based on a physical exam. Blood tests may also be done. TREATMENT  Treatment usually involves taking an antibiotic medicine. HOME CARE INSTRUCTIONS   Take your antibiotic medicine as directed by your health care provider. Finish the antibiotic even if you start to feel better.  Keep the infected arm or leg elevated to reduce swelling.  Apply a warm cloth to the affected area up to 4 times per day to relieve pain.  Take medicines only as directed by your health care provider.  Keep all follow-up visits as directed by your health care provider. SEEK MEDICAL CARE IF:   You notice red streaks coming from the infected area.  Your red area gets larger or turns dark in color.  Your bone or joint underneath the infected area becomes painful after the skin has healed.  Your infection returns in the same area or another area.  You notice a swollen bump in the infected area.  You develop new symptoms.  You have a fever. SEEK IMMEDIATE MEDICAL CARE IF:   You feel very sleepy.  You develop vomiting or diarrhea.  You have a general ill feeling (malaise) with muscle aches and  pains.   This information is not intended to replace advice given to you by your health care provider. Make sure you discuss any questions you have with your health care provider.   Document Released: 01/14/2005 Document Revised: 12/26/2014 Document Reviewed: 06/22/2011 Elsevier Interactive Patient Education [employer]

## 2015-05-23 NOTE — ED Notes (Signed)
The patient presented to the [facility] with a complaint of left knee pain that started yesterday. The patient denied any injury but stated that she has a hx of cellulitis and the symptoms feel common to the last incident in her ankle.

## 2015-06-11 ENCOUNTER — Ambulatory Visit (INDEPENDENT_AMBULATORY_CARE_PROVIDER_SITE_OTHER): Payer: 59 | Admitting: Nurse Practitioner

## 2015-06-11 ENCOUNTER — Encounter: Payer: Self-pay | Admitting: Nurse Practitioner

## 2015-06-11 VITALS — BP 128/80 | HR 72 | Temp 97.7°F | Ht 64.0 in | Wt 122.6 lb

## 2015-06-11 DIAGNOSIS — L03221 Cellulitis of neck: Secondary | ICD-10-CM | POA: Diagnosis not present

## 2015-06-11 MED ORDER — CEPHALEXIN 500 MG PO CAPS: 500.0000 mg | ORAL_CAPSULE | Freq: Four times a day (QID) | ORAL | Status: DC

## 2015-06-11 NOTE — Progress Notes (Signed)
Patient ID: Cindy Huynh, female   DOB: 05-29-1975, 40 y.o.   MRN: [id number]    PCP: Kimber Relic, MD  Advanced Directive information Does patient have an advance directive?: No, Would patient like information on creating an advanced directive?: No - patient declined information  Allergies  Allergen Reactions  . Peanuts [Peanut Oil] Anaphylaxis    All Nuts cause same reaction  . Shellfish Allergy Anaphylaxis, Itching and Rash  . Avocado Nausea And Vomiting  . Other Itching and Rash    All red berries, tomatoes    Chief Complaint  Patient presents with  . Acute Visit    Possible cellulitis of neck. Patient c/o swelling and pain in neck since Friday of last week.   Marland Kitchen Health Maintenance    Patient refused mammogram, patient will have OB/GYN address     HPI: Patient is a 40 y.o. female seen in the office today due to possible cellulitis. Last month had cellulitis in her knee. Now with similar symptoms on left side of face around ear and neck. Ear feels warm and very painful to touch.  No fevers or chills.  Started 4 days ago. Getting worse. Had open area and rough patch close to area. Open area has healed but skin still very rough.  Was on doxycyline 3 weeks ago for knee. Doxycyline made her really itchy.   Review of Systems:  Review of Systems  Constitutional: Negative for fever, chills, activity change and appetite change.  Skin: Positive for color change.       Redness, swelling and warmth, to left side of neck into ear    Past Medical History  Diagnosis Date  . Allergy   . Asthma   . Hypertension   . Eczema    Past Surgical History  Procedure Laterality Date  . None    . Ovarian cyst drainage     Social History:   reports that she has been smoking Cigarettes.  She has been smoking about 1.00 pack per day. She does not have any smokeless tobacco history on file. She reports that she drinks alcohol. She reports that she does not use illicit drugs.  No family  history on file.  Medications: Patient's Medications  New Prescriptions   No medications on file  Previous Medications   ALBUTEROL (PROVENTIL HFA;VENTOLIN HFA) 108 (90 BASE) MCG/ACT INHALER    Inhale 2 puffs into the lungs every 6 (six) hours as needed for wheezing. Pt prefers ventolin   BUDESONIDE-FORMOTEROL (SYMBICORT) 160-4.5 MCG/ACT INHALER    Inhale 2 puffs into the lungs 2 (two) times daily.   CETIRIZINE (ZYRTEC) 10 MG TABLET    Take 10 mg by mouth daily.   EPINEPHRINE (EPIPEN) 0.3 MG/0.3 ML DEVI    Inject 0.3 mLs (0.3 mg total) into the muscle once.   FLUOCINONIDE GEL (LIDEX) 0.05 %    Apply 1 application topically 2 (two) times daily.   HYDROCHLOROTHIAZIDE (HYDRODIURIL) 25 MG TABLET    TAKE 1 TABLET EVERY DAY BLOOD PRESSURE   HYDROCORTISONE 2.5 % OINTMENT    APPLY TO RASH DAILY   PREDNISONE (DELTASONE) 20 MG TABLET    Take 2 tablets daily for 1 week   SKIN PROTECTANTS, MISC. (EUCERIN) CREAM    Apply 1 application topically 3 (three) times daily.  Modified Medications   No medications on file  Discontinued Medications   DOXYCYCLINE (VIBRA-TABS) 100 MG TABLET    Take 1 tablet (100 mg total) by mouth 2 (two) times daily.  Physical Exam:  Filed Vitals:   06/11/15 0941  BP: 128/80  Pulse: 72  Temp: 97.7 F (36.5 C)  TempSrc: Oral  Height:  (1.626 m)  Weight: 122 lb 9.6 oz (55.611 kg)  SpO2: 97%   Body mass index is 21.03 kg/(m^2).  Physical Exam  Constitutional: She is oriented to person, place, and time. She appears well-developed and well-nourished. No distress.  HENT:  Right Ear: External ear normal.  Left Ear: Hearing, tympanic membrane and ear canal normal. There is swelling and tenderness. No drainage. No decreased hearing is noted.  Neck: Normal range of motion. Neck supple.  Cardiovascular: Normal rate, regular rhythm and normal heart sounds.   Pulmonary/Chest: Effort normal and breath sounds normal.  Lymphadenopathy:    She has no cervical  adenopathy.  Neurological: She is alert and oriented to person, place, and time. She has normal reflexes.  Skin: Skin is warm and dry. She is not diaphoretic. There is erythema.  Psychiatric: She has a normal mood and affect.    Labs reviewed: Basic Metabolic Panel: No results for input(s): NA, K, CL, CO2, GLUCOSE, BUN, CREATININE, CALCIUM, MG, PHOS, TSH in the last 8760 hours. Liver Function Tests: No results for input(s): AST, ALT, ALKPHOS, BILITOT, PROT, ALBUMIN in the last 8760 hours. No results for input(s): LIPASE, AMYLASE in the last 8760 hours. No results for input(s): AMMONIA in the last 8760 hours. CBC: No results for input(s): WBC, NEUTROABS, HGB, HCT, MCV, PLT in the last 8760 hours. Lipid Panel: No results for input(s): CHOL, HDL, LDLCALC, TRIG, CHOLHDL, LDLDIRECT in the last 8760 hours. TSH: No results for input(s): TSH in the last 8760 hours. A1C: No results found for: HGBA1C   Assessment/Plan 1. Cellulitis of neck Cellulitis of ear that goes down to neck, slight swelling with warmth noted. No drainage or open area. -will treat with keflex fofr 1 week- return precautions discussed - cephALEXin (KEFLEX) 500 MG capsule; Take 1 capsule (500 mg total) by mouth 4 (four) times daily.  Dispense: 28 capsule; Refill: 0   Crystallynn Noorani K. Biagio Borg  [facility] [phone] 8 am - 5 pm) [phone] (after hours)

## 2015-06-11 NOTE — Patient Instructions (Signed)
Keflex every 6 hours for 1 week Notify if symptoms worsen while on antibiotic or fever occurs   Cellulitis Cellulitis is an infection of the skin and the tissue beneath it. The infected area is usually red and tender. Cellulitis occurs most often in the arms and lower legs.  CAUSES  Cellulitis is caused by bacteria that enter the skin through cracks or cuts in the skin. The most common types of bacteria that cause cellulitis are staphylococci and streptococci. SIGNS AND SYMPTOMS   Redness and warmth.    Swelling.  Tenderness or pain.  Fever. DIAGNOSIS  Your health care provider can usually determine what is wrong based on a physical exam. Blood tests may also be done. TREATMENT  Treatment usually involves taking an antibiotic medicine. HOME CARE INSTRUCTIONS   Take your antibiotic medicine as directed by your health care provider. Finish the antibiotic even if you start to feel better.  Keep the infected arm or leg elevated to reduce swelling.  Apply a warm cloth to the affected area up to 4 times per day to relieve pain.  Take medicines only as directed by your health care provider.  Keep all follow-up visits as directed by your health care provider. SEEK MEDICAL CARE IF:   You notice red streaks coming from the infected area.  Your red area gets larger or turns dark in color.  Your bone or joint underneath the infected area becomes painful after the skin has healed.  Your infection returns in the same area or another area.  You notice a swollen bump in the infected area.  You develop new symptoms.  You have a fever. SEEK IMMEDIATE MEDICAL CARE IF:   You feel very sleepy.  You develop vomiting or diarrhea.  You have a general ill feeling (malaise) with muscle aches and pains.   This information is not intended to replace advice given to you by your health care provider. Make sure you discuss any questions you have with your health care provider.   Document  Released: 01/14/2005 Document Revised: 12/26/2014 Document Reviewed: 06/22/2011 Elsevier Interactive Patient Education [employer]

## 2015-06-29 ENCOUNTER — Other Ambulatory Visit: Payer: Self-pay | Admitting: Nurse Practitioner

## 2015-10-08 ENCOUNTER — Other Ambulatory Visit: Payer: Self-pay | Admitting: Internal Medicine

## 2015-10-09 ENCOUNTER — Other Ambulatory Visit: Payer: Self-pay | Admitting: *Deleted

## 2015-10-09 DIAGNOSIS — J4520 Mild intermittent asthma, uncomplicated: Secondary | ICD-10-CM

## 2015-10-09 MED ORDER — BUDESONIDE-FORMOTEROL FUMARATE 160-4.5 MCG/ACT IN AERO: 2.0000 | INHALATION_SPRAY | Freq: Two times a day (BID) | RESPIRATORY_TRACT | Status: DC

## 2015-10-09 MED ORDER — ALBUTEROL SULFATE HFA 108 (90 BASE) MCG/ACT IN AERS: 2.0000 | INHALATION_SPRAY | Freq: Four times a day (QID) | RESPIRATORY_TRACT | Status: DC | PRN

## 2015-10-09 NOTE — Telephone Encounter (Signed)
Patient requested to be faxed. Follows up yearly

## 2015-11-11 ENCOUNTER — Other Ambulatory Visit: Payer: Self-pay | Admitting: Nurse Practitioner

## 2015-11-11 ENCOUNTER — Other Ambulatory Visit: Payer: Self-pay | Admitting: Internal Medicine

## 2015-11-11 DIAGNOSIS — L309 Dermatitis, unspecified: Secondary | ICD-10-CM

## 2015-11-21 ENCOUNTER — Other Ambulatory Visit: Payer: Self-pay | Admitting: Internal Medicine

## 2015-12-12 ENCOUNTER — Ambulatory Visit (INDEPENDENT_AMBULATORY_CARE_PROVIDER_SITE_OTHER): Payer: 59 | Admitting: Nurse Practitioner

## 2015-12-12 ENCOUNTER — Encounter: Payer: Self-pay | Admitting: Nurse Practitioner

## 2015-12-12 ENCOUNTER — Other Ambulatory Visit: Payer: Self-pay | Admitting: Nurse Practitioner

## 2015-12-12 VITALS — Temp 97.7°F | Ht 64.0 in | Wt 120.0 lb

## 2015-12-12 DIAGNOSIS — I10 Essential (primary) hypertension: Secondary | ICD-10-CM

## 2015-12-12 DIAGNOSIS — R42 Dizziness and giddiness: Secondary | ICD-10-CM | POA: Diagnosis not present

## 2015-12-12 DIAGNOSIS — J019 Acute sinusitis, unspecified: Secondary | ICD-10-CM | POA: Diagnosis not present

## 2015-12-12 LAB — COMPLETE METABOLIC PANEL WITH GFR
ALT: 14 U/L (ref 6–29)
AST: 21 U/L (ref 10–30)
Albumin: 4.3 g/dL (ref 3.6–5.1)
Alkaline Phosphatase: 32 U/L — ABNORMAL LOW (ref 33–115)
BILIRUBIN TOTAL: 0.5 mg/dL (ref 0.2–1.2)
BUN: 13 mg/dL (ref 7–25)
CALCIUM: 9.4 mg/dL (ref 8.6–10.2)
CO2: 26 mmol/L (ref 20–31)
CREATININE: 0.75 mg/dL (ref 0.50–1.10)
Chloride: 103 mmol/L (ref 98–110)
Glucose, Bld: 78 mg/dL (ref 65–99)
Potassium: 3.9 mmol/L (ref 3.5–5.3)
Sodium: 139 mmol/L (ref 135–146)
TOTAL PROTEIN: 6.5 g/dL (ref 6.1–8.1)

## 2015-12-12 LAB — CBC WITH DIFFERENTIAL/PLATELET
Basophils Absolute: 63 cells/uL (ref 0–200)
Basophils Relative: 1 %
EOS PCT: 6 %
Eosinophils Absolute: 378 cells/uL (ref 15–500)
HEMATOCRIT: 43 % (ref 35.0–45.0)
Hemoglobin: 14.7 g/dL (ref 11.7–15.5)
Lymphocytes Relative: 33 %
Lymphs Abs: 2079 cells/uL (ref 850–3900)
MCH: 32.3 pg (ref 27.0–33.0)
MCHC: 34.2 g/dL (ref 32.0–36.0)
MCV: 94.5 fL (ref 80.0–100.0)
MPV: 10.5 fL (ref 7.5–12.5)
Monocytes Absolute: 504 cells/uL (ref 200–950)
Monocytes Relative: 8 %
NEUTROS PCT: 52 %
Neutro Abs: 3276 cells/uL (ref 1500–7800)
Platelets: 310 10*3/uL (ref 140–400)
RBC: 4.55 MIL/uL (ref 3.80–5.10)
RDW: 13.4 % (ref 11.0–15.0)
WBC: 6.3 10*3/uL (ref 3.8–10.8)

## 2015-12-12 MED ORDER — AMOXICILLIN-POT CLAVULANATE 875-125 MG PO TABS: 1.0000 | ORAL_TABLET | Freq: Two times a day (BID) | ORAL | 0 refills | Status: DC

## 2015-12-12 NOTE — Patient Instructions (Signed)
Follow up in 1 month for physical (make early appt and come fasting)

## 2015-12-12 NOTE — Progress Notes (Signed)
Careteam: Patient Care Team: Kimber RelicArthur G Green, MD as PCP - General (Internal Medicine)  Advanced Directive information    Allergies  Allergen Reactions  . Peanuts [Peanut Oil] Anaphylaxis    All Nuts cause same reaction  . Shellfish Allergy Anaphylaxis, Itching and Rash  . Avocado Nausea And Vomiting  . Other Itching and Rash    All red berries, tomatoes    Chief Complaint  Patient presents with  . Dizziness    Dizzness, ear ringing and head pressure     HPI: Patient is a 40 y.o. female seen in the office today due to dizziness. Started a few months ago. Would happen when she got out of bed and then it will get better.  Does not last long, goes away after a few second Now has ringing in her ears which will start and this will last about 20 mins and then will go away. No hearing loss associated with ringing in ears. Sometimes happens with dizziness but most of the time is it separate Not debilitating able to go about her day the same just notices it is there No headaches  Will have increase pressure in head. Has chronic congestion but in the last few weeks feels like head is heavy. Worsening fullness like it is stuffed with cotton.  No weakness, blurred vision, chest pains or worsening of cough or congestion. Overall feels well.  Review of Systems:  Review of Systems  Constitutional: Negative for activity change, appetite change, fatigue and unexpected weight change.  HENT: Positive for congestion, postnasal drip and rhinorrhea. Negative for ear pain, hearing loss and nosebleeds.   Eyes: Negative.   Respiratory: Negative for cough and shortness of breath.   Cardiovascular: Negative for chest pain, palpitations and leg swelling.  Gastrointestinal: Negative for abdominal pain, constipation and diarrhea.  Genitourinary: Negative for difficulty urinating and dysuria.  Musculoskeletal: Negative for arthralgias and myalgias.  Skin: Negative for color change and wound.    Allergic/Immunologic: Positive for environmental allergies.  Neurological: Positive for dizziness and weakness. Negative for tremors, seizures, syncope, facial asymmetry, speech difficulty, light-headedness, numbness and headaches.  Psychiatric/Behavioral: Negative for agitation, behavioral problems and confusion.    Past Medical History:  Diagnosis Date  . Allergy   . Asthma   . Eczema   . Hypertension    Past Surgical History:  Procedure Laterality Date  . none    . OVARIAN CYST DRAINAGE     Social History:   reports that she has been smoking Cigarettes.  She has been smoking about 1.00 pack per day. She does not have any smokeless tobacco history on file. She reports that she drinks alcohol. She reports that she does not use drugs.  History reviewed. No pertinent family history.  Medications: Patient's Medications  New Prescriptions   No medications on file  Previous Medications   ALBUTEROL (PROVENTIL HFA;VENTOLIN HFA) 108 (90 BASE) MCG/ACT INHALER    Inhale 2 puffs into the lungs every 6 (six) hours as needed for wheezing. Pt prefers ventolin   BUDESONIDE-FORMOTEROL (SYMBICORT) 160-4.5 MCG/ACT INHALER    Inhale 2 puffs into the lungs 2 (two) times daily.   EPINEPHRINE (EPIPEN) 0.3 MG/0.3 ML DEVI    Inject 0.3 mLs (0.3 mg total) into the muscle once.   FLUOCINONIDE GEL (LIDEX) 0.05 %    APPLY EXTERNALLY TO THE AFFECTED AREA TWICE DAILY   HYDROCHLOROTHIAZIDE (HYDRODIURIL) 25 MG TABLET    TAKE 1 TABLET BY MOUTH EVERY DAY FOR BLOOD PRESSURE  HYDROCORTISONE 2.5 % OINTMENT    APPLY TO THE AFFECTED AREA DAILY AS DIRECTED   PREDNISONE (DELTASONE) 20 MG TABLET    Take 2 tablets daily for 1 week   SKIN PROTECTANTS, MISC. (EUCERIN) CREAM    Apply 1 application topically 3 (three) times daily.  Modified Medications   No medications on file  Discontinued Medications   CEPHALEXIN (KEFLEX) 500 MG CAPSULE    Take 1 capsule (500 mg total) by mouth 4 (four) times daily.   CETIRIZINE  (ZYRTEC) 10 MG TABLET    Take 10 mg by mouth daily.     Physical Exam:  Vitals:   12/12/15 1526  Temp: 97.7 F (36.5 C)  TempSrc: Oral  Weight: 120 lb (54.4 kg)  Height: 5\' 4"  (1.626 m)   Body mass index is 20.6 kg/m.  Physical Exam  Constitutional: She is oriented to person, place, and time. She appears well-developed and well-nourished. No distress.  HENT:  Head: Normocephalic and atraumatic.  Right Ear: External ear normal.  Left Ear: External ear normal.  Nose: Nose normal.  Mouth/Throat: Oropharynx is clear and moist. No oropharyngeal exudate.  Eyes: Conjunctivae are normal. Pupils are equal, round, and reactive to light.  Neck: Normal range of motion. Neck supple.  Cardiovascular: Normal rate, regular rhythm and normal heart sounds.   Pulmonary/Chest: Effort normal and breath sounds normal.  Abdominal: Soft. Bowel sounds are normal.  Musculoskeletal: She exhibits no edema or tenderness.  Neurological: She is alert and oriented to person, place, and time. She has normal reflexes. She displays normal reflexes. No cranial nerve deficit. She exhibits normal muscle tone. Coordination normal.  Skin: Skin is warm and dry. She is not diaphoretic.  Psychiatric: She has a normal mood and affect.    Labs reviewed: Basic Metabolic Panel: No results for input(s): NA, K, CL, CO2, GLUCOSE, BUN, CREATININE, CALCIUM, MG, PHOS, TSH in the last 8760 hours. Liver Function Tests: No results for input(s): AST, ALT, ALKPHOS, BILITOT, PROT, ALBUMIN in the last 8760 hours. No results for input(s): LIPASE, AMYLASE in the last 8760 hours. No results for input(s): AMMONIA in the last 8760 hours. CBC: No results for input(s): WBC, NEUTROABS, HGB, HCT, MCV, PLT in the last 8760 hours. Lipid Panel: No results for input(s): CHOL, HDL, LDLCALC, TRIG, CHOLHDL, LDLDIRECT in the last 8760 hours. TSH: No results for input(s): TSH in the last 8760 hours. A1C: No results found for:  HGBA1C   Assessment/Plan 1. Dizziness and giddiness -may be coming from sinusitis, will treat at this time.  -will get blood work for other causes, may need ENT referral if persist - CBC with Differential/Platelets - COMPLETE METABOLIC PANEL WITH GFR - TSH  2. Acute sinusitis, recurrence not specified, unspecified location - amoxicillin-clavulanate (AUGMENTIN) 875-125 MG tablet; Take 1 tablet by mouth 2 (two) times daily.  Dispense: 20 tablet; Refill: 0  3. Hypertension -blood pressure slightly elevated at 136/96, no orthostatics to be causing dizziness. May need to increase medication for better control.  Will monitor at this time. Encouraged lifestyle modifications, to cont medications   To follow up in 4 weeks for follow up on dizziness and for physical. Will need fasting lipids at this appt Lujuana Kapler K. Biagio BorgEubanks, AGNP  [facility] [phone](Monday-Friday 8 am - 5 pm) [phone] (after hours)

## 2015-12-14 LAB — TSH: TSH: 1.9 m[IU]/L

## 2015-12-19 ENCOUNTER — Other Ambulatory Visit: Payer: Self-pay | Admitting: Internal Medicine

## 2015-12-19 DIAGNOSIS — J4520 Mild intermittent asthma, uncomplicated: Secondary | ICD-10-CM

## 2015-12-30 ENCOUNTER — Other Ambulatory Visit: Payer: Self-pay | Admitting: Nurse Practitioner

## 2015-12-30 IMAGING — CR DG CHEST 2V
2 series · 2 of 2 positions shown · non-contrast
Comparison: 08/10/2011

CLINICAL DATA: Smoker with cough and fever.

EXAM:
CHEST - 2 VIEW

[view not recorded (1 of 2)]
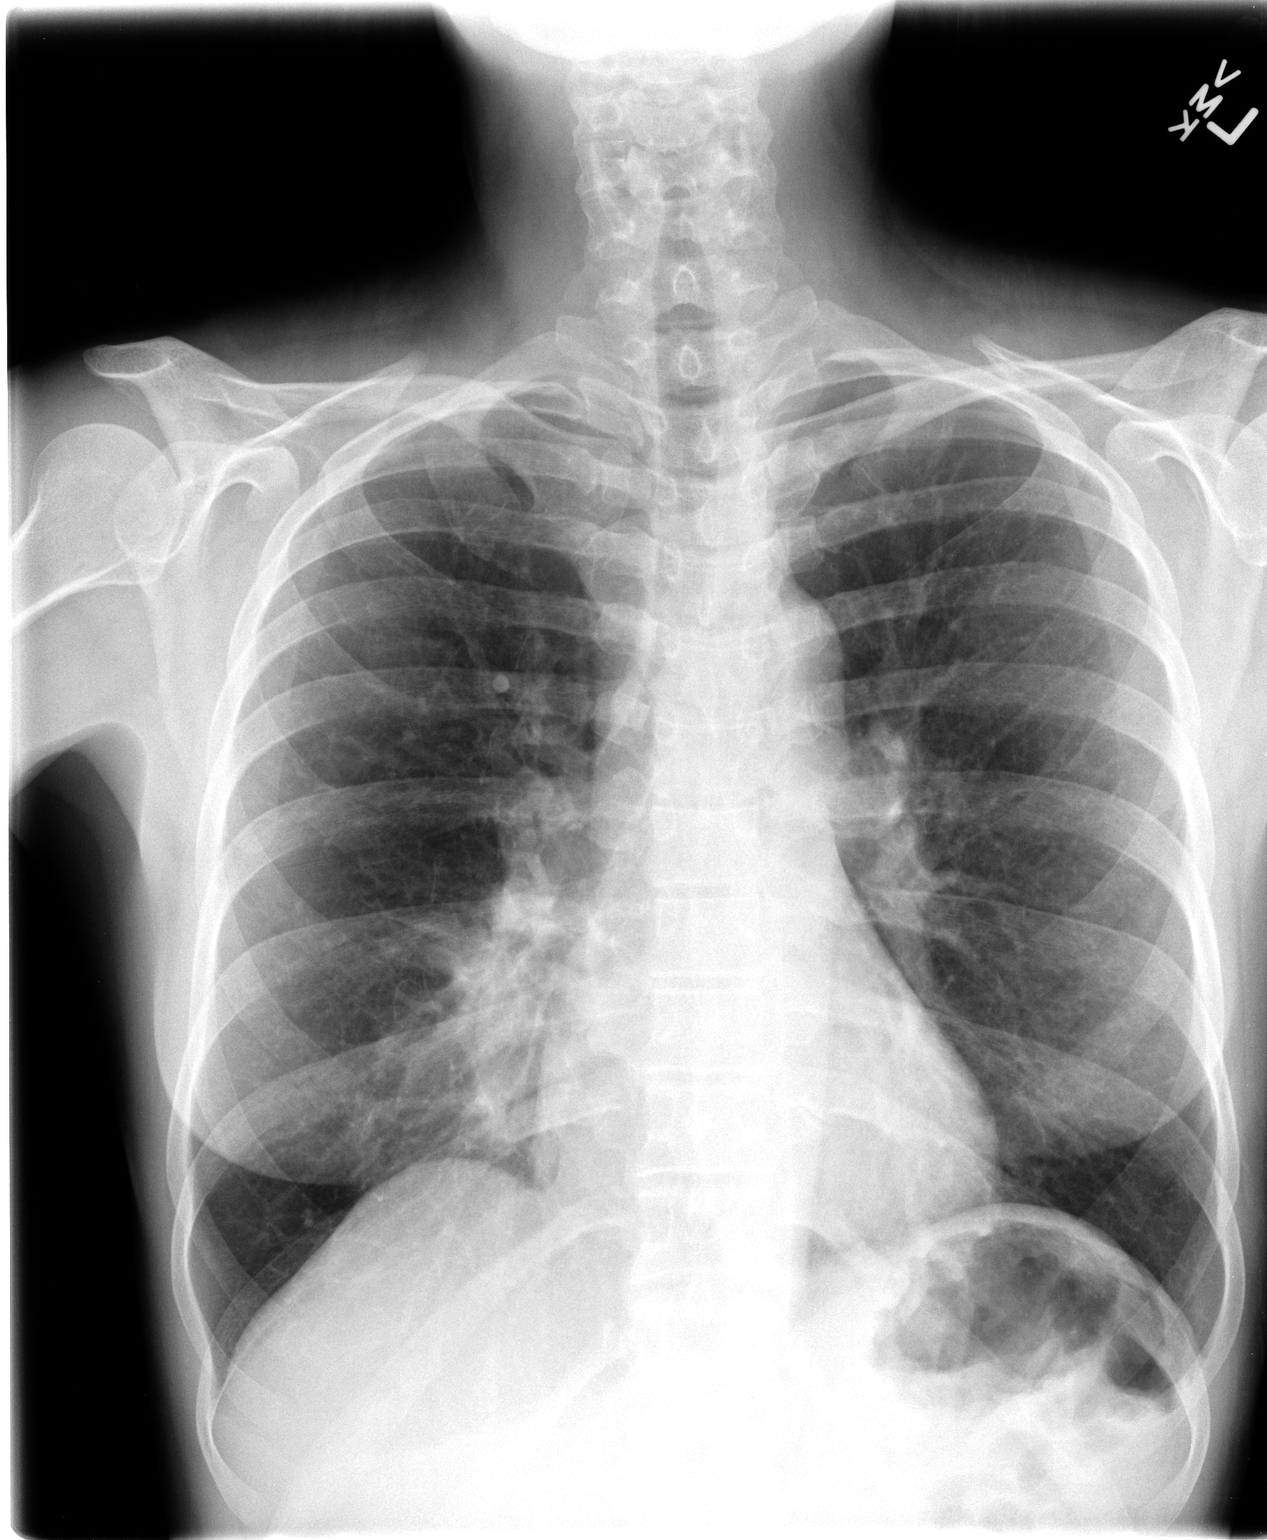

[view not recorded (2 of 2)]
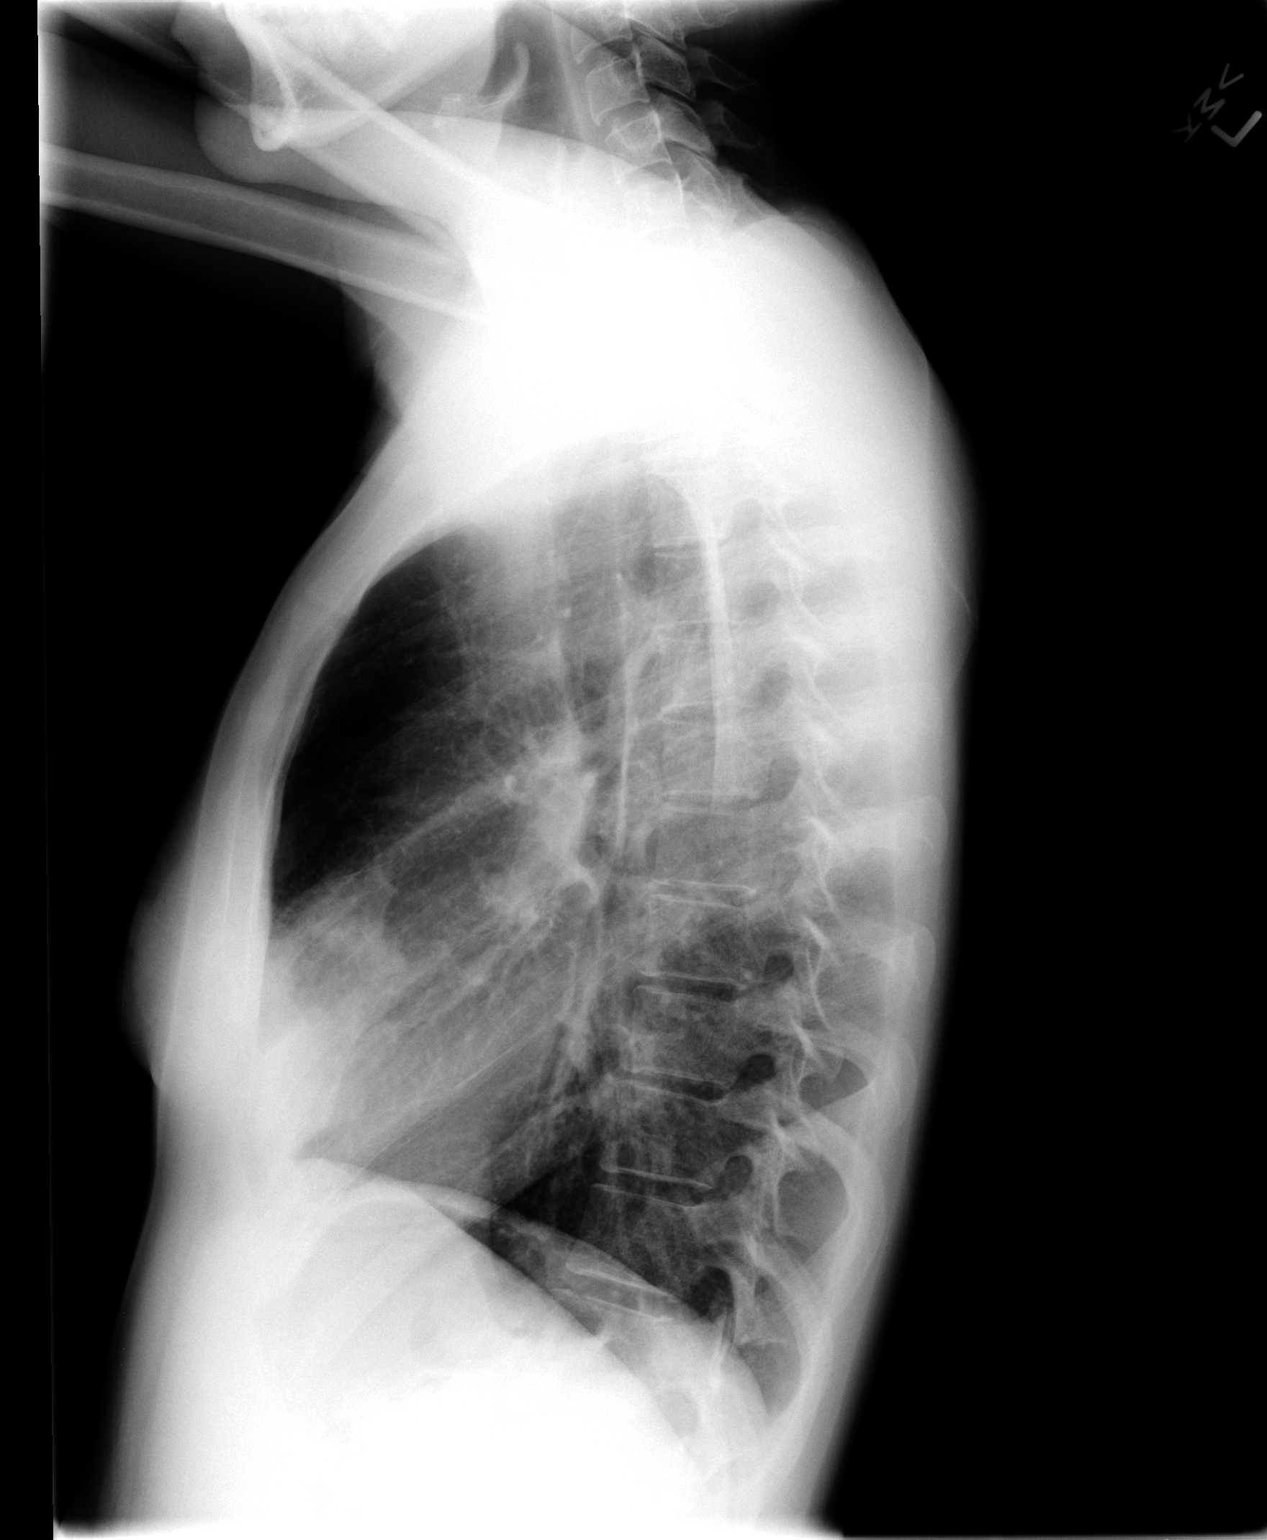

[2 of 2 positions shown; findings below may reference images not displayed]

FINDINGS: The heart size and mediastinal contours are within normal limits.
There is a probable infiltrate in the anterior aspect of the right
middle lobe. There is no evidence of pulmonary edema, pneumothorax,
nodule or pleural fluid.
IMPRESSION: Right middle lobe infiltrate.

## 2016-01-02 ENCOUNTER — Telehealth: Payer: Self-pay

## 2016-01-02 NOTE — Telephone Encounter (Signed)
Message left on triage voicemail: Patient was unable to complete antibiotic due to stomach upset. Patient only took x 1 week (rx was prescribed for 10 days)

## 2016-01-21 NOTE — Telephone Encounter (Signed)
If sinus problems are better, I would not prescribe another antibiotic at this point.

## 2016-01-28 ENCOUNTER — Other Ambulatory Visit: Payer: Self-pay | Admitting: Obstetrics & Gynecology

## 2016-01-28 DIAGNOSIS — R928 Other abnormal and inconclusive findings on diagnostic imaging of breast: Secondary | ICD-10-CM

## 2016-01-30 ENCOUNTER — Ambulatory Visit (INDEPENDENT_AMBULATORY_CARE_PROVIDER_SITE_OTHER): Payer: 59 | Admitting: Nurse Practitioner

## 2016-01-30 ENCOUNTER — Encounter: Payer: Self-pay | Admitting: Nurse Practitioner

## 2016-01-30 VITALS — BP 122/80 | HR 63 | Temp 98.1°F | Resp 18 | Ht 64.0 in | Wt 120.6 lb

## 2016-01-30 DIAGNOSIS — Z0000 Encounter for general adult medical examination without abnormal findings: Secondary | ICD-10-CM | POA: Diagnosis not present

## 2016-01-30 DIAGNOSIS — I10 Essential (primary) hypertension: Secondary | ICD-10-CM | POA: Diagnosis not present

## 2016-01-30 DIAGNOSIS — F17200 Nicotine dependence, unspecified, uncomplicated: Secondary | ICD-10-CM | POA: Diagnosis not present

## 2016-01-30 DIAGNOSIS — L309 Dermatitis, unspecified: Secondary | ICD-10-CM

## 2016-01-30 DIAGNOSIS — Z23 Encounter for immunization: Secondary | ICD-10-CM

## 2016-01-30 DIAGNOSIS — J321 Chronic frontal sinusitis: Secondary | ICD-10-CM | POA: Diagnosis not present

## 2016-01-30 LAB — LIPID PANEL
CHOL/HDL RATIO: 2.1 ratio (ref <=5.0)
CHOLESTEROL: 148 mg/dL (ref 125–200)
HDL: 69 mg/dL (ref >=46)
LDL Cholesterol: 67 mg/dL (ref <130)
Triglycerides: 61 mg/dL (ref <150)
VLDL: 12 mg/dL (ref <30)

## 2016-01-30 NOTE — Progress Notes (Signed)
Provider: Murray HodgkinsArthur Green, MD  Patient Care Team: Kimber RelicArthur G Green, MD as PCP - General (Internal Medicine)  Extended Emergency Contact Information Primary Emergency Contact: [facility]Elena Address: [address]          [place], [place] [address] [place] Home Phone: [phone] Mobile Phone: [phone] Relation: Friend Secondary Emergency Contact: King,Mike Address: [address]          [place], [place] Home Phone: [phone] Mobile Phone: [phone] Relation: Significant other Allergies  Allergen Reactions  . Peanuts [Peanut Oil] Anaphylaxis    All Nuts cause same reaction  . Shellfish Allergy Anaphylaxis, Itching and Rash  . Avocado Nausea And Vomiting  . Other Itching and Rash    All red berries, tomatoes   Code Status: FULL Goals of Care: Advanced Directive information Advanced Directives 01/30/2016  Does patient have an advance directive? No  Would patient like information on creating an advanced directive? -     Chief Complaint  Patient presents with  . Medical Management of Chronic Issues    Complete physical.   . Other    Does not want flu vaccine    HPI: Patient is a 40 y.o. female seen in today for an annual wellness exam.   Major illnesses or hospitalization in the last year  Pt seen by GYN for breast exam and PAP, abnormal cell biopsy was negative.  [facility]  Had mammogram and plan to repeat right breast follow up scheduled   [facility] 2[facility] 01/30/2016 06/11/2015 04/02/2014 08/02/2012  Decreased Interest 0 0 0 0  Down, Depressed, Hopeless 0 0 0 -  PHQ - 2 Score 0 0 0 0    Fall Risk  01/30/2016 06/11/2015 10/05/2014 04/02/2014 08/02/2012  Falls in the past year? No No No No No   No flowsheet data found.   Health Maintenance  Topic Date Due  . HIV Screening  07/29/1989  . INFLUENZA VACCINE  04/20/2016 (Originally 11/19/2015)  . PAP SMEAR  04/20/2016  . TETANUS/TDAP   07/21/2023  declines flu vaccine Has had prevnar-- needs pneumococcal    Diet? Attempts to follow heart healthy, no fired food, limits salt.   Visual Acuity Screening   Right eye Left eye Both eyes  Without correction:     With correction: 20/20 20/20 20/20    No routine exercise  Dentition:  Does not go   Pain: none  Past Medical History:  Diagnosis Date  . Allergy   . Asthma   . Eczema   . Hypertension     Past Surgical History:  Procedure Laterality Date  . none    . OVARIAN CYST DRAINAGE      Social History   Social History  . Marital status: Married    Spouse name: N/A  . Number of children: N/A  . Years of education: N/A   Social History Main Topics  . Smoking status: Current Every Day Smoker    Packs/day: 1.00    Types: Cigarettes  . Smokeless tobacco: Never Used  . Alcohol use Yes     Comment: socially  . Drug use: No  . Sexual activity: Yes    Birth control/ protection: IUD   Other Topics Concern  . None   Social History Narrative  . None   No desire to quit smoking.   History reviewed. No pertinent family history.  Review of Systems:  Review of Systems  Constitutional: Negative for  activity change, appetite change, fatigue and unexpected weight change.  HENT: Positive for congestion and sinus pressure. Negative for hearing loss.   Eyes: Negative.   Respiratory: Negative for cough and shortness of breath.   Cardiovascular: Negative for chest pain, palpitations and leg swelling.  Gastrointestinal: Negative for abdominal pain, constipation and diarrhea.  Genitourinary: Negative for difficulty urinating and dysuria.  Musculoskeletal: Negative for arthralgias and myalgias.  Skin: Negative for color change and wound.  Neurological: Positive for dizziness (comes and goes ). Negative for weakness.  Psychiatric/Behavioral: Negative for agitation, behavioral problems and confusion.       Medication List       Accurate as of 01/30/16   9:30 AM. Always use your most recent med list.          budesonide-formoterol 160-4.5 MCG/ACT inhaler Commonly known as:  SYMBICORT Inhale 2 puffs into the lungs 2 (two) times daily.   EPINEPHrine 0.3 mg/0.3 mL Devi Commonly known as:  EPIPEN Inject 0.3 mLs (0.3 mg total) into the muscle once.   eucerin cream Apply 1 application topically 3 (three) times daily.   fluocinonide gel 0.05 % Commonly known as:  LIDEX APPLY EXTERNALLY TO THE AFFECTED AREA TWICE DAILY   hydrochlorothiazide 25 MG tablet Commonly known as:  HYDRODIURIL TAKE 1 TABLET BY MOUTH EVERY DAY FOR BLOOD PRESSURE   hydrocortisone 2.5 % ointment APPLY TO THE AFFECTED AREA DAILY AS DIRECTED   VENTOLIN HFA 108 (90 Base) MCG/ACT inhaler Generic drug:  albuterol INHALE 2 PUFFS INTO THE LUNGS EVERY 6 HOURS AS NEEDED FOR WHEEZING         Physical Exam: Vitals:   01/30/16 0909  BP: 122/80  Pulse: 63  Resp: 18  Temp: 98.1 F (36.7 C)  TempSrc: Oral  SpO2: 95%  Weight: 120 lb 9.6 oz (54.7 kg)  Height: 5\' 4"  (1.626 m)   Body mass index is 20.7 kg/m. Physical Exam  Constitutional: She is oriented to person, place, and time. She appears well-developed and well-nourished. No distress.  HENT:  Head: Normocephalic and atraumatic.  Right Ear: External ear normal.  Left Ear: External ear normal.  Nose: Nose normal.  Mouth/Throat: Oropharynx is clear and moist. No oropharyngeal exudate.  Eyes: Conjunctivae are normal. Pupils are equal, round, and reactive to light.  Neck: Normal range of motion. Neck supple.  Cardiovascular: Normal rate, regular rhythm and normal heart sounds.   Pulmonary/Chest: Effort normal and breath sounds normal.  Abdominal: Soft. Bowel sounds are normal.  Musculoskeletal: She exhibits no edema or tenderness.  Neurological: She is alert and oriented to person, place, and time. She has normal reflexes. She displays normal reflexes. No cranial nerve deficit. She exhibits normal muscle  tone. Coordination normal.  Skin: Skin is warm and dry. She is not diaphoretic.  Psychiatric: She has a normal mood and affect.    Labs reviewed: Basic Metabolic Panel:  Recent Labs  16/10/96 1620  NA 139  K 3.9  CL 103  CO2 26  GLUCOSE 78  BUN 13  CREATININE 0.75  CALCIUM 9.4  TSH 1.90   Liver Function Tests:  Recent Labs  12/12/15 1620  AST 21  ALT 14  ALKPHOS 32*  BILITOT 0.5  PROT 6.5  ALBUMIN 4.3   No results for input(s): LIPASE, AMYLASE in the last 8760 hours. No results for input(s): AMMONIA in the last 8760 hours. CBC:  Recent Labs  12/12/15 1620  WBC 6.3  NEUTROABS 3,276  HGB 14.7  HCT 43.0  MCV 94.5  PLT 310   Lipid Panel: No results for input(s): CHOL, HDL, LDLCALC, TRIG, CHOLHDL, LDLDIRECT in the last 8760 hours. No results found for: HGBA1C  Procedures: No results found.  Assessment/Plan 1. Annual physical exam No new complaints today, pt has been doing well. The patient was counseled regarding the appropriate use of alcohol, regular self-examination of the breasts on a monthly basis, prevention of dental and periodontal disease, diet, regular sustained exercise for at least 30 minutes 5 times per week, routine screening interval for mammogram as recommended by the American Cancer Society and ACOG, importance of regular PAP smears, smoking cessation, tobacco use,  and recommended schedule for GI hemoccult testing, colonoscopy, cholesterol, thyroid and diabetes screening.  2. Chronic frontal sinusitis -could not complete Augmentin due to GI upset, however symptoms improved, encouraged nettipot daily and plan saline as needed   3. Essential hypertension -controlled on current medication, cont HCTz - Lipid panel - EKG 12-Lead  4. Eczema, unspecified type -uses prednisone during flare, has not had dexa, reorder placed for bone density and encouraged to go for imagining  - DG Bone Density; Future  5. Tobacco use disorder Not motivated to  quit, encouraged smoking cessation.  6. Need for pneumococcal vaccine - Pneumococcal polysaccharide vaccine 23-valent greater than or equal to 2yo subcutaneous/IM  Follow up in 6 months, sooner if needed  Marjan Rosman K. Biagio Borg  [facility] [phone] 8 am - 5 pm) [phone] (after hours)

## 2016-01-30 NOTE — Patient Instructions (Addendum)
Recommend dentist every 6 months   Need to get bone density test-- this is done at the breast center, could get repeat mammogram and bone density at the same time.   30 mins of exercise daily  Stop smoking!   Netti pot to use daily  PLAIN nasal saline as needed throughout the day   Health Maintenance, Female Adopting a healthy lifestyle and getting preventive care can go a long way to promote health and wellness. Talk with your health care provider about what schedule of regular examinations is right for you. This is a good chance for you to check in with your provider about disease prevention and staying healthy. In between checkups, there are plenty of things you can do on your own. Experts have done a lot of research about which lifestyle changes and preventive measures are most likely to keep you healthy. Ask your health care provider for more information. WEIGHT AND DIET  Eat a healthy diet  Be sure to include plenty of vegetables, fruits, low-fat dairy products, and lean protein.  Do not eat a lot of foods high in solid fats, added sugars, or salt.  Get regular exercise. This is one of the most important things you can do for your health.  Most adults should exercise for at least 150 minutes each week. The exercise should increase your heart rate and make you sweat (moderate-intensity exercise).  Most adults should also do strengthening exercises at least twice a week. This is in addition to the moderate-intensity exercise.  Maintain a healthy weight  Body mass index (BMI) is a measurement that can be used to identify possible weight problems. It estimates body fat based on height and weight. Your health care provider can help determine your BMI and help you achieve or maintain a healthy weight.  For females 34 years of age and older:   A BMI below 18.5 is considered underweight.  A BMI of 18.5 to 24.9 is normal.  A BMI of 25 to 29.9 is considered overweight.  A BMI of  30 and above is considered obese.  Watch levels of cholesterol and blood lipids  You should start having your blood tested for lipids and cholesterol at 41 years of age, then have this test every 5 years.  You may need to have your cholesterol levels checked more often if:  Your lipid or cholesterol levels are high.  You are older than 41 years of age.  You are at high risk for heart disease.  CANCER SCREENING   Lung Cancer  Lung cancer screening is recommended for adults 78-37 years old who are at high risk for lung cancer because of a history of smoking.  A yearly low-dose CT scan of the lungs is recommended for people who:  Currently smoke.  Have quit within the past 15 years.  Have at least a 30-pack-year history of smoking. A pack year is smoking an average of one pack of cigarettes a day for 1 year.  Yearly screening should continue until it has been 15 years since you quit.  Yearly screening should stop if you develop a health problem that would prevent you from having lung cancer treatment.  Breast Cancer  Practice breast self-awareness. This means understanding how your breasts normally appear and feel.  It also means doing regular breast self-exams. Let your health care provider know about any changes, no matter how small.  If you are in your 20s or 30s, you should have a clinical breast exam (  CBE) by a health care provider every 1-3 years as part of a regular health exam.  If you are 61 or older, have a CBE every year. Also consider having a breast X-ray (mammogram) every year.  If you have a family history of breast cancer, talk to your health care provider about genetic screening.  If you are at high risk for breast cancer, talk to your health care provider about having an MRI and a mammogram every year.  Breast cancer gene (BRCA) assessment is recommended for women who have family members with BRCA-related cancers. BRCA-related cancers  include:  Breast.  Ovarian.  Tubal.  Peritoneal cancers.  Results of the assessment will determine the need for genetic counseling and BRCA1 and BRCA2 testing. Cervical Cancer Your health care provider may recommend that you be screened regularly for cancer of the pelvic organs (ovaries, uterus, and vagina). This screening involves a pelvic examination, including checking for microscopic changes to the surface of your cervix (Pap test). You may be encouraged to have this screening done every 3 years, beginning at age 86.  For women ages 65-65, health care providers may recommend pelvic exams and Pap testing every 3 years, or they may recommend the Pap and pelvic exam, combined with testing for human papilloma virus (HPV), every 5 years. Some types of HPV increase your risk of cervical cancer. Testing for HPV may also be done on women of any age with unclear Pap test results.  Other health care providers may not recommend any screening for nonpregnant women who are considered low risk for pelvic cancer and who do not have symptoms. Ask your health care provider if a screening pelvic exam is right for you.  If you have had past treatment for cervical cancer or a condition that could lead to cancer, you need Pap tests and screening for cancer for at least 20 years after your treatment. If Pap tests have been discontinued, your risk factors (such as having a new sexual partner) need to be reassessed to determine if screening should resume. Some women have medical problems that increase the chance of getting cervical cancer. In these cases, your health care provider may recommend more frequent screening and Pap tests. Colorectal Cancer  This type of cancer can be detected and often prevented.  Routine colorectal cancer screening usually begins at 41 years of age and continues through 41 years of age.  Your health care provider may recommend screening at an earlier age if you have risk factors for  colon cancer.  Your health care provider may also recommend using home test kits to check for hidden blood in the stool.  A small camera at the end of a tube can be used to examine your colon directly (sigmoidoscopy or colonoscopy). This is done to check for the earliest forms of colorectal cancer.  Routine screening usually begins at age 29.  Direct examination of the colon should be repeated every 5-10 years through 41 years of age. However, you may need to be screened more often if early forms of precancerous polyps or small growths are found. Skin Cancer  Check your skin from head to toe regularly.  Tell your health care provider about any new moles or changes in moles, especially if there is a change in a mole's shape or color.  Also tell your health care provider if you have a mole that is larger than the size of a pencil eraser.  Always use sunscreen. Apply sunscreen liberally and repeatedly throughout  the day.  Protect yourself by wearing long sleeves, pants, a wide-brimmed hat, and sunglasses whenever you are outside. HEART DISEASE, DIABETES, AND HIGH BLOOD PRESSURE   High blood pressure causes heart disease and increases the risk of stroke. High blood pressure is more likely to develop in:  People who have blood pressure in the high end of the normal range (130-139/85-89 mm Hg).  People who are overweight or obese.  People who are African American.  If you are 18-39 years of age, have your blood pressure checked every 3-5 years. If you are 40 years of age or older, have your blood pressure checked every year. You should have your blood pressure measured twice--once when you are at a hospital or clinic, and once when you are not at a hospital or clinic. Record the average of the two measurements. To check your blood pressure when you are not at a hospital or clinic, you can use:  An automated blood pressure machine at a pharmacy.  A home blood pressure monitor.  If you  are between 55 years and 79 years old, ask your health care provider if you should take aspirin to prevent strokes.  Have regular diabetes screenings. This involves taking a blood sample to check your fasting blood sugar level.  If you are at a normal weight and have a low risk for diabetes, have this test once every three years after 41 years of age.  If you are overweight and have a high risk for diabetes, consider being tested at a younger age or more often. PREVENTING INFECTION  Hepatitis B  If you have a higher risk for hepatitis B, you should be screened for this virus. You are considered at high risk for hepatitis B if:  You were born in a country where hepatitis B is common. Ask your health care provider which countries are considered high risk.  Your parents were born in a high-risk country, and you have not been immunized against hepatitis B (hepatitis B vaccine).  You have HIV or AIDS.  You use needles to inject street drugs.  You live with someone who has hepatitis B.  You have had sex with someone who has hepatitis B.  You get hemodialysis treatment.  You take certain medicines for conditions, including cancer, organ transplantation, and autoimmune conditions. Hepatitis C  Blood testing is recommended for:  Everyone born from 1945 through 1965.  Anyone with known risk factors for hepatitis C. Sexually transmitted infections (STIs)  You should be screened for sexually transmitted infections (STIs) including gonorrhea and chlamydia if:  You are sexually active and are younger than 41 years of age.  You are older than 41 years of age and your health care provider tells you that you are at risk for this type of infection.  Your sexual activity has changed since you were last screened and you are at an increased risk for chlamydia or gonorrhea. Ask your health care provider if you are at risk.  If you do not have HIV, but are at risk, it may be recommended that you  take a prescription medicine daily to prevent HIV infection. This is called pre-exposure prophylaxis (PrEP). You are considered at risk if:  You are sexually active and do not regularly use condoms or know the HIV status of your partner(s).  You take drugs by injection.  You are sexually active with a partner who has HIV. Talk with your health care provider about whether you are at high risk   of being infected with HIV. If you choose to begin PrEP, you should first be tested for HIV. You should then be tested every 3 months for as long as you are taking PrEP.  PREGNANCY   If you are premenopausal and you may become pregnant, ask your health care provider about preconception counseling.  If you may become pregnant, take 400 to 800 micrograms (mcg) of folic acid every day.  If you want to prevent pregnancy, talk to your health care provider about birth control (contraception). OSTEOPOROSIS AND MENOPAUSE   Osteoporosis is a disease in which the bones lose minerals and strength with aging. This can result in serious bone fractures. Your risk for osteoporosis can be identified using a bone density scan.  If you are 65 years of age or older, or if you are at risk for osteoporosis and fractures, ask your health care provider if you should be screened.  Ask your health care provider whether you should take a calcium or vitamin D supplement to lower your risk for osteoporosis.  Menopause may have certain physical symptoms and risks.  Hormone replacement therapy may reduce some of these symptoms and risks. Talk to your health care provider about whether hormone replacement therapy is right for you.  HOME CARE INSTRUCTIONS   Schedule regular health, dental, and eye exams.  Stay current with your immunizations.   Do not use any tobacco products including cigarettes, chewing tobacco, or electronic cigarettes.  If you are pregnant, do not drink alcohol.  If you are breastfeeding, limit how  much and how often you drink alcohol.  Limit alcohol intake to no more than 1 drink per day for nonpregnant women. One drink equals 12 ounces of beer, 5 ounces of wine, or 1 ounces of hard liquor.  Do not use street drugs.  Do not share needles.  Ask your health care provider for help if you need support or information about quitting drugs.  Tell your health care provider if you often feel depressed.  Tell your health care provider if you have ever been abused or do not feel safe at home.   This information is not intended to replace advice given to you by your health care provider. Make sure you discuss any questions you have with your health care provider.   Document Released: 10/20/2010 Document Revised: 04/27/2014 Document Reviewed: 03/08/2013 Elsevier Interactive Patient Education 2016 [employer]  

## 2016-02-07 ENCOUNTER — Ambulatory Visit
Admission: RE | Admit: 2016-02-07 | Discharge: 2016-02-07 | Disposition: A | Discharge status: Home / Self Care | Payer: 59 | Source: Ambulatory Visit | Attending: Obstetrics & Gynecology | Admitting: Obstetrics & Gynecology

## 2016-02-07 DIAGNOSIS — R928 Other abnormal and inconclusive findings on diagnostic imaging of breast: Secondary | ICD-10-CM

## 2016-02-07 LAB — HM PAP SMEAR

## 2016-02-21 ENCOUNTER — Other Ambulatory Visit: Payer: Self-pay | Admitting: Internal Medicine

## 2016-02-21 DIAGNOSIS — J4520 Mild intermittent asthma, uncomplicated: Secondary | ICD-10-CM

## 2016-03-25 ENCOUNTER — Other Ambulatory Visit: Payer: Self-pay | Admitting: Internal Medicine

## 2016-03-25 DIAGNOSIS — L309 Dermatitis, unspecified: Secondary | ICD-10-CM

## 2016-05-16 ENCOUNTER — Other Ambulatory Visit: Payer: Self-pay | Admitting: Internal Medicine

## 2016-05-16 DIAGNOSIS — J4520 Mild intermittent asthma, uncomplicated: Secondary | ICD-10-CM

## 2016-05-29 ENCOUNTER — Other Ambulatory Visit: Payer: Self-pay | Admitting: Nurse Practitioner

## 2016-06-05 ENCOUNTER — Encounter: Payer: Self-pay | Admitting: Nurse Practitioner

## 2016-07-13 DIAGNOSIS — R5381 Other malaise: Secondary | ICD-10-CM | POA: Diagnosis not present

## 2016-07-13 DIAGNOSIS — R0981 Nasal congestion: Secondary | ICD-10-CM | POA: Diagnosis not present

## 2016-07-13 DIAGNOSIS — J069 Acute upper respiratory infection, unspecified: Secondary | ICD-10-CM | POA: Diagnosis not present

## 2016-07-13 DIAGNOSIS — R0602 Shortness of breath: Secondary | ICD-10-CM | POA: Diagnosis not present

## 2016-07-16 DIAGNOSIS — I10 Essential (primary) hypertension: Secondary | ICD-10-CM | POA: Diagnosis not present

## 2016-07-16 DIAGNOSIS — J45909 Unspecified asthma, uncomplicated: Secondary | ICD-10-CM | POA: Diagnosis not present

## 2016-07-21 ENCOUNTER — Encounter (HOSPITAL_COMMUNITY): Payer: Self-pay | Admitting: Emergency Medicine

## 2016-07-21 ENCOUNTER — Emergency Department (HOSPITAL_COMMUNITY)
Admission: EM | Admit: 2016-07-21 | Discharge: 2016-07-22 | Disposition: A | Discharge status: Home / Self Care | Payer: 59 | Attending: Emergency Medicine | Admitting: Emergency Medicine

## 2016-07-21 DIAGNOSIS — Z79899 Other long term (current) drug therapy: Secondary | ICD-10-CM | POA: Diagnosis not present

## 2016-07-21 DIAGNOSIS — J45909 Unspecified asthma, uncomplicated: Secondary | ICD-10-CM | POA: Insufficient documentation

## 2016-07-21 DIAGNOSIS — F17210 Nicotine dependence, cigarettes, uncomplicated: Secondary | ICD-10-CM | POA: Diagnosis not present

## 2016-07-21 DIAGNOSIS — Z91010 Allergy to peanuts: Secondary | ICD-10-CM | POA: Diagnosis not present

## 2016-07-21 DIAGNOSIS — T7840XA Allergy, unspecified, initial encounter: Secondary | ICD-10-CM | POA: Diagnosis present

## 2016-07-21 DIAGNOSIS — T782XXA Anaphylactic shock, unspecified, initial encounter: Secondary | ICD-10-CM | POA: Diagnosis not present

## 2016-07-21 DIAGNOSIS — I10 Essential (primary) hypertension: Secondary | ICD-10-CM | POA: Insufficient documentation

## 2016-07-21 MED ORDER — EPINEPHRINE PF 1 MG/ML IJ SOLN
INTRAMUSCULAR | Status: AC
  Filled 2016-07-21: qty 1

## 2016-07-21 MED ORDER — DIPHENHYDRAMINE HCL 50 MG/ML IJ SOLN
INTRAMUSCULAR | Status: AC
  Filled 2016-07-21: qty 1

## 2016-07-21 MED ORDER — EPINEPHRINE PF 1 MG/10ML IJ SOSY
0.3000 mg | PREFILLED_SYRINGE | Freq: Once | INTRAMUSCULAR | Status: AC
  Administered 2016-07-21: 0.3 mg via INTRAVENOUS
  Filled 2016-07-21: qty 10

## 2016-07-21 MED ORDER — FAMOTIDINE IN NACL 20-0.9 MG/50ML-% IV SOLN
20.0000 mg | Freq: Once | INTRAVENOUS | Status: AC
  Administered 2016-07-21: 20 mg via INTRAVENOUS
  Filled 2016-07-21: qty 50

## 2016-07-21 MED ORDER — DIPHENHYDRAMINE HCL 50 MG/ML IJ SOLN
50.0000 mg | Freq: Once | INTRAMUSCULAR | Status: AC
  Administered 2016-07-21: 50 mg via INTRAVENOUS

## 2016-07-21 MED ORDER — EPINEPHRINE PF 1 MG/10ML IJ SOSY
PREFILLED_SYRINGE | INTRAMUSCULAR | Status: AC
  Filled 2016-07-21: qty 10

## 2016-07-21 MED ORDER — EPINEPHRINE 0.3 MG/0.3ML IJ SOAJ
INTRAMUSCULAR | Status: AC
  Filled 2016-07-21: qty 0.3

## 2016-07-21 MED ORDER — METHYLPREDNISOLONE SODIUM SUCC 125 MG IJ SOLR
125.0000 mg | Freq: Once | INTRAMUSCULAR | Status: AC
  Administered 2016-07-21: 125 mg via INTRAVENOUS

## 2016-07-21 MED ORDER — METHYLPREDNISOLONE SODIUM SUCC 125 MG IJ SOLR
INTRAMUSCULAR | Status: AC
  Filled 2016-07-21: qty 2

## 2016-07-21 NOTE — ED Provider Notes (Signed)
[facility]-EMERGENCY DEPT Provider Note   CSN: [id number] Arrival date & time: 07/21/16  2335  By signing my name below, I, Arianna Nassar, attest that this documentation has been prepared under the direction and in the presence of Shon Baton, MD.  Electronically Signed: Octavia Heir, ED Scribe. 07/21/16. 11:51 PM.    History   Chief Complaint Chief Complaint  Patient presents with  . Allergic Reaction    The history is provided by the patient. No language interpreter was used.   HPI Comments: Cindy Huynh is a 41 y.o. female who eczema, HTN, and asthma has a PMhx of presents to the Emergency Department presenting with a severe allergic reaction that began ~ 1 hour ago. She has associated facial redness, shortness of breath, congestion, generalized erythematous rash, and vomitingx2 . Pt has a very extensive list of food allergies. She reports tonight she had a few beers and a shot this evening. She further expresses eating chinese food from a place she normally orders from and reports that she went to bed after having intercourse. She expresses that when she laid down she felt short of breath, "hot all over", clammy behind her ears, and "not normal". She has an epi pen but notes not using it due to it being ~ 2 years ago. She expresses that she vomited extensively shortly after. Pt notes that she had 6 puffs of her inhaler without any relief. Pt has been intubated after "flat-lining" 3 times in 2004 after a severe allergic reaction. She has also been hospitalized in the past for her allergic reaction.   Past Medical History:  Diagnosis Date  . Allergy   . Asthma   . Eczema   . Hypertension     Patient Active Problem List   Diagnosis Date Noted  . Eczema 10/07/2014  . Tobacco use disorder 08/02/2012  . Acute upper respiratory infections of unspecified site 08/02/2012  . Disorder of skin or subcutaneous tissue 08/02/2012  . Pneumonia, organism unspecified(486) 08/02/2012  .  Ganglion of tendon sheath 08/02/2012  . Intrinsic asthma, unspecified 08/02/2012  . Encounter for long-term (current) use of other medications 08/02/2012  . Essential hypertension 08/02/2012  . Allergic rhinitis, cause unspecified 08/02/2012  . Asthma 08/02/2012  . Dermatitis 08/02/2012    Past Surgical History:  Procedure Laterality Date  . none    . OVARIAN CYST DRAINAGE      OB History    No data available       Home Medications    Prior to Admission medications   Medication Sig Start Date End Date Taking? Authorizing Provider  budesonide-formoterol (SYMBICORT) 160-4.5 MCG/ACT inhaler Inhale 2 puffs into the lungs 2 (two) times daily. Patient taking differently: Inhale 1 puff into the lungs 2 (two) times daily.  10/09/15  Yes Kimber Relic, MD  EPINEPHrine (EPIPEN) 0.3 mg/0.3 mL DEVI Inject 0.3 mLs (0.3 mg total) into the muscle once. Patient taking differently: Inject 0.3 mg into the muscle daily as needed (allergic reaction).  08/02/12  Yes Sharon Seller, NP  fluocinonide gel (LIDEX) 0.05 % APPLY EXTERNALLY TO THE AFFECTED AREA TWICE DAILY Patient taking differently: APPLY EXTERNALLY TO THE AFFECTED AREA TWICE DAILY AS NEEDED FOR ECZEMA 11/22/15  Yes Kimber Relic, MD  guaiFENesin (MUCINEX) 600 MG 12 hr tablet Take 600 mg by mouth daily.   Yes Historical Provider, MD  hydrochlorothiazide (HYDRODIURIL) 25 MG tablet TAKE 1 TABLET BY MOUTH EVERY DAY FOR BLOOD PRESSURE 06/01/16  Yes Janene Harvey  Eubanks, NP  hydrocortisone 2.5 % ointment APPLY TO THE AFFECTED AREA DAILY AS DIRECTED 03/26/16  Yes Kirt Boys, DO  loratadine (CLARITIN) 10 MG tablet Take 10 mg by mouth daily.   Yes Historical Provider, MD  Skin Protectants, Misc. (EUCERIN) cream Apply 1 application topically 3 (three) times daily as needed for dry skin.    Yes Historical Provider, MD  VENTOLIN HFA 108 (90 Base) MCG/ACT inhaler INHALE 2 PUFFS INTO THE LUNGS EVERY 6 HOURS AS NEEDED FOR WHEEZING 05/18/16  Yes Kimber Relic, MD  diphenhydrAMINE (BENADRYL) 25 mg capsule Take 1 capsule (25 mg total) by mouth every 6 (six) hours as needed. 07/22/16   Shon Baton, MD  EPINEPHrine (EPIPEN 2-PAK) 0.3 mg/0.3 mL IJ SOAJ injection Inject 0.3 mLs (0.3 mg total) into the muscle once. 07/22/16 07/22/16  Shon Baton, MD  famotidine (PEPCID) 20 MG tablet Take 1 tablet (20 mg total) by mouth 2 (two) times daily. 07/22/16   Shon Baton, MD  predniSONE (DELTASONE) 20 MG tablet Take 2 tablets (40 mg total) by mouth daily. 07/22/16   Shon Baton, MD    Family History No family history on file.  Social History Social History  Substance Use Topics  . Smoking status: Current Every Day Smoker    Packs/day: 1.00    Types: Cigarettes  . Smokeless tobacco: Never Used  . Alcohol use Yes     Comment: socially     Allergies   Peanuts [peanut oil]; Shellfish allergy; Avocado; and Other   Review of Systems Review of Systems  HENT: Positive for congestion.   Respiratory: Positive for cough, shortness of breath and wheezing.   Cardiovascular: Negative for chest pain.  Gastrointestinal: Positive for nausea and vomiting.  Skin: Positive for rash.  Allergic/Immunologic: Positive for food allergies.  All other systems reviewed and are negative.    Physical Exam Updated Vital Signs BP 104/73   Pulse 67   Temp 97.6 F (36.4 C) (Oral)   Resp 14   Ht  (1.626 m)   Wt 120 lb (54.4 kg)   SpO2 100%   BMI 20.60 kg/m   Physical Exam  Constitutional: She is oriented to person, place, and time.  Ill-appearing, flushed, speaking in full senses  HENT:  Head: Normocephalic and atraumatic.  Erythema noted over the entire face with puffiness bilateral eyes and cheeks, oropharynx moist and clear, no oropharyngeal swelling noted  Eyes: Pupils are equal, round, and reactive to light.  Cardiovascular: Regular rhythm and normal heart sounds.   Tachycardia  Pulmonary/Chest: Effort normal. No respiratory  distress. She has wheezes.  Abdominal: Soft. There is no tenderness.  Musculoskeletal: She exhibits no edema.  Neurological: She is alert and oriented to person, place, and time.  Skin: Skin is warm and dry.  Dry rash bilateral lower legs with excoriations, diffuse erythema over the bilateral upper extremities and trunk, no hives noted  Psychiatric: She has a normal mood and affect.  Nursing note and vitals reviewed.    ED Treatments / Results  DIAGNOSTIC STUDIES: Oxygen Saturation is 92% on 2L min/[place], low by my interpretation.  COORDINATION OF CARE:  11:48 PM Discussed treatment plan with pt at bedside and pt agreed to plan.  12:01 AM Pt sates she is feeling much better after the administration of epi.    Labs (all labs ordered are listed, but only abnormal results are displayed) Labs Reviewed - No data to display  EKG  EKG Interpretation  None       Radiology No results found.  Procedures Procedures (including critical care time)  CRITICAL CARE Performed by: Shon Baton   Total critical care time: 35 minutes  Critical care time was exclusive of separately billable procedures and treating other patients.  Critical care was necessary to treat or prevent imminent or life-threatening deterioration.  Critical care was time spent personally by me on the following activities: development of treatment plan with patient and/or surrogate as well as nursing, discussions with consultants, evaluation of patient's response to treatment, examination of patient, obtaining history from patient or surrogate, ordering and performing treatments and interventions, ordering and review of laboratory studies, ordering and review of radiographic studies, pulse oximetry and re-evaluation of patient's condition.   Medications Ordered in ED Medications  methylPREDNISolone sodium succinate (SOLU-MEDROL) 125 mg/2 mL injection 125 mg (125 mg Intravenous Given 07/21/16 2350)  EPINEPHrine  (ADRENALIN) 1 MG/10ML injection 0.3 mg (0.3 mg Intravenous Given 07/21/16 2350)  famotidine (PEPCID) IVPB 20 mg premix (0 mg Intravenous Stopped 07/22/16 0020)  diphenhydrAMINE (BENADRYL) injection 50 mg (50 mg Intravenous Given 07/21/16 2350)     Initial Impression / Assessment and Plan / ED Course  I have reviewed the triage vital signs and the nursing notes.  Pertinent labs & imaging results that were available during my care of the patient were reviewed by me and considered in my medical decision making (see chart for details).     Patient presents with anaphylaxis. She has a history of multiple food allergies and has multiple system involvement. Vital signs are reassuring at this time. She was given epinephrine, Benadryl, Pepcid, and Solu-Medrol. She had almost immediate improvement of symptoms after epinephrine. On recheck, she is more calm and states she feels much better. No longer short of breath. Erythema resolved. Will monitor for 4-6 hours.  0510 AM Patient continues to feel much improved. She is ambulatory independently. Eating and drinking. No signs or symptoms of recurrent reaction. Will discharge home with Pepcid, Benadryl, steroids, and epinephrine.  After history, exam, and medical workup I feel the patient has been appropriately medically screened and is safe for discharge home. Pertinent diagnoses were discussed with the patient. Patient was given return precautions.   Final Clinical Impressions(s) / ED Diagnoses   Final diagnoses:  Anaphylaxis, initial encounter   I personally performed the services described in this documentation, which was scribed in my presence. The recorded information has been reviewed and is accurate.  New Prescriptions New Prescriptions   DIPHENHYDRAMINE (BENADRYL) 25 MG CAPSULE    Take 1 capsule (25 mg total) by mouth every 6 (six) hours as needed.   EPINEPHRINE (EPIPEN 2-PAK) 0.3 MG/0.3 ML IJ SOAJ INJECTION    Inject 0.3 mLs (0.3 mg total) into  the muscle once.   FAMOTIDINE (PEPCID) 20 MG TABLET    Take 1 tablet (20 mg total) by mouth 2 (two) times daily.   PREDNISONE (DELTASONE) 20 MG TABLET    Take 2 tablets (40 mg total) by mouth daily.     Shon Baton, MD 07/22/16 [phone]

## 2016-07-21 NOTE — ED Triage Notes (Signed)
Pt in POV, hx allergies to peanuts and shellfish. Pt had chinese food tonight (10p) attempted to sleep and pt had sudden onset SOB, hives, flush feeling, itching, feeling of "throat closing" EDP at bedside

## 2016-07-22 ENCOUNTER — Other Ambulatory Visit: Payer: Self-pay | Admitting: Nurse Practitioner

## 2016-07-22 MED ORDER — PREDNISONE 20 MG PO TABS
40.0000 mg | ORAL_TABLET | Freq: Every day | ORAL | 0 refills | Status: DC
Start: 2016-07-22 — End: 2023-05-20

## 2016-07-22 MED ORDER — DIPHENHYDRAMINE HCL 25 MG PO CAPS: 25.0000 mg | ORAL_CAPSULE | Freq: Four times a day (QID) | ORAL | 0 refills | Status: DC | PRN

## 2016-07-22 MED ORDER — FAMOTIDINE 20 MG PO TABS: 20.0000 mg | ORAL_TABLET | Freq: Two times a day (BID) | ORAL | 0 refills | Status: DC

## 2016-07-22 MED ORDER — EPINEPHRINE 0.3 MG/0.3ML IJ SOAJ: 0.3000 mg | Freq: Once | INTRAMUSCULAR | 1 refills | Status: AC

## 2016-07-22 NOTE — Discharge Instructions (Signed)
Please return if you haven't any new or worsening symptoms. If he had a recurrent reaction user epinephrine pen and return to the hospital immediately. Call 911 if necessarily. Try to avoid known allergens.

## 2016-07-23 MED ORDER — HYDROCHLOROTHIAZIDE 25 MG PO TABS: 25.0000 mg | ORAL_TABLET | Freq: Every day | ORAL | 0 refills | Status: AC

## 2016-08-04 ENCOUNTER — Ambulatory Visit: Payer: 59 | Admitting: Internal Medicine

## 2016-08-28 ENCOUNTER — Other Ambulatory Visit: Payer: Self-pay | Admitting: Internal Medicine

## 2016-10-14 ENCOUNTER — Other Ambulatory Visit: Payer: Self-pay | Admitting: Internal Medicine

## 2016-10-14 DIAGNOSIS — J4520 Mild intermittent asthma, uncomplicated: Secondary | ICD-10-CM

## 2016-11-23 ENCOUNTER — Other Ambulatory Visit: Payer: Self-pay | Admitting: Internal Medicine

## 2016-11-23 ENCOUNTER — Other Ambulatory Visit: Payer: Self-pay | Admitting: Nurse Practitioner

## 2016-11-23 DIAGNOSIS — J4520 Mild intermittent asthma, uncomplicated: Secondary | ICD-10-CM

## 2016-12-25 ENCOUNTER — Other Ambulatory Visit: Payer: Self-pay | Admitting: Nurse Practitioner

## 2016-12-25 DIAGNOSIS — J4520 Mild intermittent asthma, uncomplicated: Secondary | ICD-10-CM

## 2016-12-29 DIAGNOSIS — I10 Essential (primary) hypertension: Secondary | ICD-10-CM | POA: Diagnosis not present

## 2016-12-29 DIAGNOSIS — J45909 Unspecified asthma, uncomplicated: Secondary | ICD-10-CM | POA: Diagnosis not present

## 2016-12-29 DIAGNOSIS — L239 Allergic contact dermatitis, unspecified cause: Secondary | ICD-10-CM | POA: Diagnosis not present

## 2017-01-13 ENCOUNTER — Other Ambulatory Visit: Payer: Self-pay | Admitting: Internal Medicine

## 2017-03-05 DIAGNOSIS — Z6820 Body mass index (BMI) 20.0-20.9, adult: Secondary | ICD-10-CM | POA: Diagnosis not present

## 2017-03-05 DIAGNOSIS — Z01419 Encounter for gynecological examination (general) (routine) without abnormal findings: Secondary | ICD-10-CM | POA: Diagnosis not present

## 2017-03-05 DIAGNOSIS — Z1231 Encounter for screening mammogram for malignant neoplasm of breast: Secondary | ICD-10-CM | POA: Diagnosis not present

## 2017-03-05 DIAGNOSIS — R87610 Atypical squamous cells of undetermined significance on cytologic smear of cervix (ASC-US): Secondary | ICD-10-CM | POA: Diagnosis not present

## 2017-03-10 ENCOUNTER — Other Ambulatory Visit: Payer: Self-pay | Admitting: Obstetrics and Gynecology

## 2017-03-10 DIAGNOSIS — R928 Other abnormal and inconclusive findings on diagnostic imaging of breast: Secondary | ICD-10-CM

## 2017-03-19 ENCOUNTER — Ambulatory Visit: Payer: 59

## 2017-03-19 ENCOUNTER — Ambulatory Visit
Admission: RE | Admit: 2017-03-19 | Discharge: 2017-03-19 | Disposition: A | Discharge status: Home / Self Care | Payer: 59 | Source: Ambulatory Visit | Attending: Obstetrics and Gynecology | Admitting: Obstetrics and Gynecology

## 2017-03-19 DIAGNOSIS — R922 Inconclusive mammogram: Secondary | ICD-10-CM | POA: Diagnosis not present

## 2017-03-19 DIAGNOSIS — R928 Other abnormal and inconclusive findings on diagnostic imaging of breast: Secondary | ICD-10-CM

## 2017-04-01 ENCOUNTER — Other Ambulatory Visit: Payer: Self-pay | Admitting: Internal Medicine

## 2017-04-01 DIAGNOSIS — J4520 Mild intermittent asthma, uncomplicated: Secondary | ICD-10-CM

## 2017-04-07 ENCOUNTER — Telehealth: Payer: Self-pay

## 2017-04-07 NOTE — Telephone Encounter (Signed)
-----   Message from Sharon SellerJessica K Eubanks, NP sent at 04/05/2017  3:09 PM EST ----- Is pt planning on coming back to our office? It has been over a year since last OV. Also needs to get Dexa Scan due to recurrent prednisone use.  Thanks,  Shanda BumpsJessica  ----- Message ----- From: SYSTEM Sent: 04/05/2017  12:05 AM To: Sharon SellerJessica K Eubanks, NP

## 2017-04-07 NOTE — Telephone Encounter (Signed)
Left message on voicemail for patient to return call when available   

## 2017-04-12 DIAGNOSIS — L309 Dermatitis, unspecified: Secondary | ICD-10-CM | POA: Diagnosis not present

## 2017-04-12 DIAGNOSIS — J4530 Mild persistent asthma, uncomplicated: Secondary | ICD-10-CM | POA: Diagnosis not present

## 2017-04-12 DIAGNOSIS — J301 Allergic rhinitis due to pollen: Secondary | ICD-10-CM | POA: Diagnosis not present

## 2017-04-21 NOTE — Telephone Encounter (Signed)
Call never returned  

## 2018-03-11 DIAGNOSIS — I10 Essential (primary) hypertension: Secondary | ICD-10-CM | POA: Diagnosis not present

## 2018-03-11 DIAGNOSIS — N912 Amenorrhea, unspecified: Secondary | ICD-10-CM | POA: Diagnosis not present

## 2018-03-11 DIAGNOSIS — R5383 Other fatigue: Secondary | ICD-10-CM | POA: Diagnosis not present

## 2018-03-11 DIAGNOSIS — L209 Atopic dermatitis, unspecified: Secondary | ICD-10-CM | POA: Diagnosis not present

## 2018-03-11 DIAGNOSIS — Z0000 Encounter for general adult medical examination without abnormal findings: Secondary | ICD-10-CM | POA: Diagnosis not present

## 2019-02-13 ENCOUNTER — Other Ambulatory Visit: Payer: Self-pay

## 2019-02-13 DIAGNOSIS — Z20822 Contact with and (suspected) exposure to covid-19: Secondary | ICD-10-CM

## 2019-02-14 LAB — NOVEL CORONAVIRUS, NAA: SARS-CoV-2, NAA: NOT DETECTED

## 2020-12-31 DIAGNOSIS — Z13220 Encounter for screening for lipoid disorders: Secondary | ICD-10-CM | POA: Diagnosis not present

## 2020-12-31 DIAGNOSIS — J4540 Moderate persistent asthma, uncomplicated: Secondary | ICD-10-CM | POA: Diagnosis not present

## 2020-12-31 DIAGNOSIS — Z975 Presence of (intrauterine) contraceptive device: Secondary | ICD-10-CM | POA: Diagnosis not present

## 2020-12-31 DIAGNOSIS — I10 Essential (primary) hypertension: Secondary | ICD-10-CM | POA: Diagnosis not present

## 2020-12-31 DIAGNOSIS — H938X3 Other specified disorders of ear, bilateral: Secondary | ICD-10-CM | POA: Diagnosis not present

## 2021-01-03 DIAGNOSIS — Z79899 Other long term (current) drug therapy: Secondary | ICD-10-CM | POA: Diagnosis not present

## 2021-01-03 DIAGNOSIS — L2089 Other atopic dermatitis: Secondary | ICD-10-CM | POA: Diagnosis not present

## 2021-02-07 DIAGNOSIS — Z79899 Other long term (current) drug therapy: Secondary | ICD-10-CM | POA: Diagnosis not present

## 2021-02-07 DIAGNOSIS — L2084 Intrinsic (allergic) eczema: Secondary | ICD-10-CM | POA: Diagnosis not present

## 2021-03-03 DIAGNOSIS — I10 Essential (primary) hypertension: Secondary | ICD-10-CM | POA: Diagnosis not present

## 2021-03-11 DIAGNOSIS — L2089 Other atopic dermatitis: Secondary | ICD-10-CM | POA: Diagnosis not present

## 2021-05-08 DIAGNOSIS — L2089 Other atopic dermatitis: Secondary | ICD-10-CM | POA: Diagnosis not present

## 2021-05-08 DIAGNOSIS — Z79899 Other long term (current) drug therapy: Secondary | ICD-10-CM | POA: Diagnosis not present

## 2021-06-06 DIAGNOSIS — H00014 Hordeolum externum left upper eyelid: Secondary | ICD-10-CM | POA: Diagnosis not present

## 2021-06-27 DIAGNOSIS — R7401 Elevation of levels of liver transaminase levels: Secondary | ICD-10-CM | POA: Diagnosis not present

## 2021-09-05 DIAGNOSIS — L2089 Other atopic dermatitis: Secondary | ICD-10-CM | POA: Diagnosis not present

## 2021-09-05 DIAGNOSIS — Z79899 Other long term (current) drug therapy: Secondary | ICD-10-CM | POA: Diagnosis not present

## 2021-10-17 DIAGNOSIS — J4540 Moderate persistent asthma, uncomplicated: Secondary | ICD-10-CM | POA: Diagnosis not present

## 2021-10-17 DIAGNOSIS — L309 Dermatitis, unspecified: Secondary | ICD-10-CM | POA: Diagnosis not present

## 2021-10-17 DIAGNOSIS — Z79899 Other long term (current) drug therapy: Secondary | ICD-10-CM | POA: Diagnosis not present

## 2021-10-17 DIAGNOSIS — I10 Essential (primary) hypertension: Secondary | ICD-10-CM | POA: Diagnosis not present

## 2022-03-23 DIAGNOSIS — L2089 Other atopic dermatitis: Secondary | ICD-10-CM | POA: Diagnosis not present

## 2022-03-23 DIAGNOSIS — Z79899 Other long term (current) drug therapy: Secondary | ICD-10-CM | POA: Diagnosis not present

## 2022-03-23 DIAGNOSIS — L309 Dermatitis, unspecified: Secondary | ICD-10-CM | POA: Diagnosis not present

## 2022-05-04 MED ORDER — RINVOQ 15 MG TABLET,EXTENDED RELEASE
ORAL_TABLET | Freq: Every day | ORAL | 11 refills | 0 days
Start: 2022-05-04 — End: ?

## 2022-05-06 DIAGNOSIS — L2089 Other atopic dermatitis: Principal | ICD-10-CM

## 2022-05-08 DIAGNOSIS — L2089 Other atopic dermatitis: Secondary | ICD-10-CM | POA: Diagnosis not present

## 2022-05-08 DIAGNOSIS — L718 Other rosacea: Secondary | ICD-10-CM | POA: Diagnosis not present

## 2022-05-12 DIAGNOSIS — L2089 Other atopic dermatitis: Principal | ICD-10-CM

## 2022-05-13 NOTE — Unmapped (Signed)
[facility] Specialty Medication Onboarding    Specialty Medication: RINVOQ 15 mg Tb24 (upadacitinib)  Prior Authorization: Approved   Financial Assistance: Yes - copay card approved as secondary   Final Copay/Day Supply: $5 / 30    Insurance Restrictions: Yes - max 1 month supply     Notes to Pharmacist: n/a    The triage team has completed the benefits investigation and has determined that the patient is able to fill this medication at [facility]. Please contact the patient to complete the onboarding or follow up with the prescribing physician as needed.

## 2022-05-14 NOTE — Unmapped (Signed)
[facility]   Patient Onboarding/Medication Counseling    Patient has received samples from clinic and has been on medication since around December 4th      Annette Carpenter is a 47 y.o. female with atopic dermatitis who I am counseling today on initiation of therapy.  I am speaking to the patient.    Was a Nurse, learning disability used for this call? No    Verified patient's date of birth / HIPAA.    Specialty medication(s) to be sent: Inflammatory Disorders: Rinvoq      Non-specialty medications/supplies to be sent: n/a      Medications not needed at this time: n/a         Rinvoq (upadacitinib)    The patient declined counseling on medication administration, missed dose instructions, goals of therapy, side effects and monitoring parameters, warnings and precautions, drug/food interactions, and storage, handling precautions, and disposal because they have taken the medication previously. The information in the declined sections below are for informational purposes only and was not discussed with patient.       Medication & Administration     Dosage: Atopic dermatitis, age 23-65 years, weight at least 40 kg: Take 15 mg by mouth once daily    Lab tests required prior to treatment initiation:  Tuberculosis: Tuberculosis screening not documented in the patient's chart but medication prescriber has indicated they are aware and wishing to initiate treatment at this time.per [facility] on 05/14/22  Hepatitis B: Hepatitis B serology studies are not documented in the patient's chart but medication prescriber has indicated they are aware and wishing to initiate treatment at this time.per [facility] on 05/14/22  Absolute lymphocyte count: ALC greater than 500/mm3.(Completed: 03/23/22 - in media tab)  Absolute neutrophil count: ANC greater than 1,000/mm3.(Completed: 03/23/22 - in media tab)  Hemoglobin: Hemoglobin is greater than 8 g/dL.(Completed: 03/23/22 - in media tab)  Liver function tests: Baseline ALT and AST were evaluated and are documented in the patient chart prior to treatment initiation.(Completed: 03/23/22 - in media tab)   Pregnancy: Pregnancy status unconfirmed in patient chart but medication prescriber has indicated they are aware and wishing to initiate treatment at this time.per [facility] on 05/14/22      Administration: Take tablets with or without food.  Swallow tablets whole; do not chew, break, or crush.    Adherence/Missed dose instructions:  Take a missed dose as soon as you think about it.  If it has been more than 8-12 hours since your normal dosing time, skip the missed dose and go back to your normal time the following day.  Never take 2 doses at the same time or extra doses in an attempt to 'catch up' after a missed dose.    Goals of Therapy     Achieve symptom remission  Slow disease progression  Protection of remaining articular structures  Maintenance of function  Maintenance of effective psychosocial functioning    Side Effects & Monitoring Parameters     Upset stomach  Signs of a common cold - minor sore throat, runny or stuffy nose, etc.    The following side effects should be reported to the provider:  Signs of a hypersensitivity reaction - rash; hives; itching; red, swollen, blistered, or peeling skin; wheezing; tightness in the chest or throat; difficulty breathing, swallowing, or talking; swelling of the mouth, face, lips, tongue, or throat; etc.  Reduced immune function - report signs of infection such as fever; chills; body aches;  very bad sore throat; ear or sinus pain; cough; more sputum or change in color of sputum; pain with passing urine; wound that will not heal, etc.  Also at a slightly higher risk of some malignancies (mainly skin and blood cancers) due to this reduced immune function.  A new skin growth or lump  Stomach pain that is new or worse or change in bowel habits  Signs of a blood clot - chest pain or pressure; coughing up blood; shortness of breath; swelling, warmth, numbness, or pain in a leg or arm  Signs of MACE - stroke: trouble walking, speaking, and understanding, as well as paralysis or numbness of the face, arm, or leg; MI: tightness or pain in the chest, neck, back, or arms, as well as fatigue, lightheadedness, abnormal heartbeat, and anxiety      Contraindications, Warnings, & Precautions     Have your bloodwork checked as you have been told by your prescriber  Talk with your doctor if you are pregnant, planning to become pregnant, or breastfeeding - women taking this medication must use birth control while taking this drug and for some time after the last dose  Discuss the possible need for holding your dose(s) of Rinvoq?? when a planned procedure is scheduled with the prescriber as it may delay healing/recovery timeline       Drug/Food Interactions     Medication list reviewed in Epic. The patient was instructed to inform the care team before taking any new medications or supplements. No drug interactions identified.   Talk with you prescriber or pharmacist before receiving any live vaccinations while taking this medication and after you stop taking it    Storage, Handling Precautions, & Disposal     Store in the original container at room temperature and protect from moisture.      Current Medications (including OTC/herbals), Comorbidities and Allergies     Current Outpatient Medications   Medication Sig Dispense Refill    albuterol HFA 90 mcg/actuation inhaler Inhale 2 puffs every six (6) hours as needed for wheezing.      cetirizine (ZYRTEC) 10 MG tablet Take 1 tablet (10 mg total) by mouth daily.      fluocinonide (LIDEX) 0.05 % gel Apply 1 Application topically as needed.      hydroCHLOROthiazide (HYDRODIURIL) 25 MG tablet Take 1 tablet (25 mg total) by mouth daily.      hydrocortisone 2.5 % cream Apply topically daily.      upadacitinib (RINVOQ) 15 mg Tb24 Take 15 mg (1 tablet) by mouth daily. 30 tablet 11     No current facility-administered medications for this visit.       Allergies   Allergen Reactions    Peanut (Legumes) Anaphylaxis    Shellfish Containing Products Anaphylaxis    Tree Nuts Anaphylaxis    Avocado Nausea And Vomiting and Rash       There is no problem list on file for this patient.      Reviewed and up to date in Epic.    Appropriateness of Therapy     Acute infections noted within Epic:  No active infections  Patient reported infection: None    Is medication and dose appropriate based on diagnosis and infection status? Yes    Prescription has been clinically reviewed: Yes      Baseline Quality of Life Assessment      How many days over the past month did your atopic dermatitis  keep you from your normal activities? For  example, brushing your teeth or getting up in the morning. 0    Financial Information     Medication Assistance provided: Prior Authorization and Copay Assistance    Anticipated copay of $5 reviewed with patient. Verified delivery address.    Delivery Information     Scheduled delivery date: 2/7    Expected start date: 2/7    Medication will be delivered via UPS to the prescription address in [facility].  This shipment will not require a signature.      Explained the services we provide at [facility] and that each month we would call to set up refills.  Stressed importance of returning phone calls so that we could ensure they receive their medications in time each month.  Informed patient that we should be setting up refills 7-10 days prior to when they will run out of medication.  A pharmacist will reach out to perform a clinical assessment periodically.  Informed patient that a welcome packet, containing information about our pharmacy and other support services, a Notice of Privacy Practices, and a drug information handout will be sent.      The patient or caregiver noted above participated in the development of this care plan and knows that they can request review of or adjustments to the care plan at any time.      Patient or caregiver verbalized understanding of the above information as well as how to contact the pharmacy at [phone] option 4 with any questions/concerns.  The pharmacy is open Monday through Friday 8:30am-4:30pm.  A pharmacist is available 24/7 via pager to answer any clinical questions they may have.    Patient Specific Needs     Does the patient have any physical, cognitive, or cultural barriers? No    Does the patient have adequate living arrangements? (i.e. the ability to store and take their medication appropriately) Yes    Did you identify any home environmental safety or security hazards? No    Patient prefers to have medications discussed with  Patient     Is the patient or caregiver able to read and understand education materials at a high school level or above? Yes    Patient's primary language is  English     Is the patient high risk? No    SOCIAL DETERMINANTS OF HEALTH     At the [facility], we have learned that life circumstances - like trouble affording food, housing, utilities, or transportation can affect the health of many of our patients.   That is why we wanted to ask: are you currently experiencing any life circumstances that are negatively impacting your health and/or quality of life? No    Social Determinants of Psychologist, prison and probation services Strain: Not on file   Internet Connectivity: Not on file   Food Insecurity: Not on file   Tobacco Use: Not on file   Housing/Utilities: Not on file   Alcohol Use: Not on file   Transportation Needs: Not on file   Substance Use: Not on file   Health Literacy: Not on file   Physical Activity: Not on file   Interpersonal Safety: Not on file   Stress: Not on file   Intimate Partner Violence: Not on file   Depression: Not on file   Social Connections: Not on file       Would you be willing to receive help with any of the needs that you have identified today? No  Teofilo Pod, PharmD  [facility] Specialty Pharmacist

## 2022-05-26 MED FILL — RINVOQ 15 MG TABLET,EXTENDED RELEASE: ORAL | 30 days supply | Qty: 30 | Fill #0

## 2022-07-27 NOTE — Unmapped (Signed)
[facility] Clinical Assessment & Refill Coordination Note    Annette Carpenter, DOB: 05/29/1975  Phone: [phone] (home)     All above HIPAA information was verified with patient.     Was a Nurse, learning disability used for this call? No    Specialty Medication(s):   Inflammatory Disorders: Rinvoq     Current Outpatient Medications   Medication Sig Dispense Refill    albuterol HFA 90 mcg/actuation inhaler Inhale 2 puffs every six (6) hours as needed for wheezing.      cetirizine (ZYRTEC) 10 MG tablet Take 1 tablet (10 mg total) by mouth daily.      fluocinonide (LIDEX) 0.05 % gel Apply 1 Application topically as needed.      hydroCHLOROthiazide (HYDRODIURIL) 25 MG tablet Take 1 tablet (25 mg total) by mouth daily.      hydrocortisone 2.5 % cream Apply topically daily.      upadacitinib (RINVOQ) 15 mg Tb24 Take 15 mg (1 tablet) by mouth daily. 30 tablet 11     No current facility-administered medications for this visit.        Changes to medications: Annette Carpenter reports no changes at this time.    Allergies   Allergen Reactions    Peanut (Legumes) Anaphylaxis    Shellfish Containing Products Anaphylaxis    Tree Nuts Anaphylaxis    Avocado Nausea And Vomiting and Rash       Changes to allergies: No    SPECIALTY MEDICATION ADHERENCE     Rinvoq 15 mg: 2 days of medicine on hand   Medication Adherence    Specialty Medication: Rinvoq 15mg  - 1 QD  Patient is on additional specialty medications: No  Patient is on more than two specialty medications: No  Any gaps in refill history greater than 2 weeks in the last 3 months: no  Demonstrates understanding of importance of adherence: yes  Informant: patient          Specialty medication(s) dose(s) confirmed: Regimen is correct and unchanged.     Are there any concerns with adherence? No    Adherence counseling provided? Not needed    CLINICAL MANAGEMENT AND INTERVENTION      Clinical Benefit Assessment:    Do you feel the medicine is effective or helping your condition? Yes    Clinical Benefit counseling provided? Not needed    Adverse Effects Assessment:    Are you experiencing any side effects? No    Are you experiencing difficulty administering your medicine? No    Quality of Life Assessment:    Quality of Life    Rheumatology  Oncology  Dermatology  Cystic Fibrosis          How many days over the past month did your atopic dermatitis  keep you from your normal activities? For example, brushing your teeth or getting up in the morning. 0    Have you discussed this with your provider? Not needed    Acute Infection Status:    Acute infections noted within Epic:  No active infections  Patient reported infection: None    Therapy Appropriateness:    Is therapy appropriate and patient progressing towards therapeutic goals? Yes, therapy is appropriate and should be continued    DISEASE/MEDICATION-SPECIFIC INFORMATION      N/A    Chronic Inflammatory Diseases: Have you experienced any flares in the last month? No  Has this been reported to your provider? Not applicable    PATIENT SPECIFIC NEEDS  Does the patient have any physical, cognitive, or cultural barriers? No    Is the patient high risk? No    Did the patient require a clinical intervention? No    Does the patient require physician intervention or other additional services (i.e., nutrition, smoking cessation, social work)? No    SOCIAL DETERMINANTS OF HEALTH     At the [facility], we have learned that life circumstances - like trouble affording food, housing, utilities, or transportation can affect the health of many of our patients.   That is why we wanted to ask: are you currently experiencing any life circumstances that are negatively impacting your health and/or quality of life? No    Social Determinants of Psychologist, prison and probation services Strain: Not on file   Internet Connectivity: Not on file   Food Insecurity: Not on file   Tobacco Use: Not on file   Housing/Utilities: Not on file   Alcohol Use: Not on file Transportation Needs: Not on file   Substance Use: Not on file   Health Literacy: Not on file   Physical Activity: Not on file   Interpersonal Safety: Not on file   Stress: Not on file   Intimate Partner Violence: Not on file   Depression: Not on file   Social Connections: Not on file       Would you be willing to receive help with any of the needs that you have identified today? Not applicable       SHIPPING     Specialty Medication(s) to be Shipped:   Inflammatory Disorders: Rinvoq    Other medication(s) to be shipped: No additional medications requested for fill at this time     Changes to insurance: No    Patient was informed of new phone menu: Yes    Delivery Scheduled: Yes, Expected medication delivery date: 4/9.     Medication will be delivered via UPS to the confirmed prescription address in [facility].    The patient will receive a drug information handout for each medication shipped and additional FDA Medication Guides as required.  Verified that patient has previously received a Conservation officer, historic buildings and a Surveyor, mining.    The patient or caregiver noted above participated in the development of this care plan and knows that they can request review of or adjustments to the care plan at any time.      All of the patient's questions and concerns have been addressed.    Teofilo Pod, PharmD   [facility] Specialty Pharmacist

## 2022-07-28 MED FILL — RINVOQ 15 MG TABLET,EXTENDED RELEASE: ORAL | 30 days supply | Qty: 30 | Fill #1

## 2022-08-27 NOTE — Unmapped (Signed)
[facility] Refill Coordination Note    Specialty Medication(s) to be Shipped:   Inflammatory Disorders: Rinvoq    Other medication(s) to be shipped: No additional medications requested for fill at this time     Annette Carpenter, DOB: October 13, 1975  Phone: [phone] (home)       All above HIPAA information was verified with patient.     Was a Nurse, learning disability used for this call? No    Completed refill call assessment today to schedule patient's medication shipment from the [facility] [phone]).  All relevant notes have been reviewed.     Specialty medication(s) and dose(s) confirmed: Regimen is correct and unchanged.   Changes to medications: Ozelle reports no changes at this time.  Changes to insurance: No  New side effects reported not previously addressed with a pharmacist or physician: None reported  Questions for the pharmacist: No    Confirmed patient received a Conservation officer, historic buildings and a Surveyor, mining with first shipment. The patient will receive a drug information handout for each medication shipped and additional FDA Medication Guides as required.       DISEASE/MEDICATION-SPECIFIC INFORMATION        N/A    SPECIALTY MEDICATION ADHERENCE     Medication Adherence    Patient reported X missed doses in the last month: 0  Specialty Medication: Rinvoq 15 mg  Patient is on additional specialty medications: No              Were doses missed due to medication being on hold? No    Rinvoq 15 mg: 1 days of medicine on hand        REFERRAL TO PHARMACIST     Referral to the pharmacist: Not needed      [facility]     Shipping address confirmed in Epic.       Delivery Scheduled: Yes, Expected medication delivery date: 08/31/22.     Medication will be delivered via UPS to the prescription address in Epic [facility].    Willette Pa   [facility] Specialty Technician

## 2022-08-28 MED FILL — RINVOQ 15 MG TABLET,EXTENDED RELEASE: ORAL | 30 days supply | Qty: 30 | Fill #2

## 2022-09-25 NOTE — Unmapped (Signed)
[facility] Special[facility] Pharm[facility] Refill Coordination Note    Specialty Medication(s) to be Shipped:   Inflammatory Disorders: Rinvoq    Other medication(s) to be shipped: No additional medications requested for fill at this time     Annette Carpenter, DOB: 08-11-1975  Phone: [phone] (home)       All above HIPAA information was verified with patient.     Was a Nurse, learning disability used for this call? No    Completed refill call assessment today to schedule patient's medication shipment from the [facility] [phone]).  All relevant notes have been reviewed.     Specialty medication(s) and dose(s) confirmed: Regimen is correct and unchanged.   Changes to medications: Annette Carpenter reports no changes at this time.  Changes to insurance: No  New side effects reported not previously addressed with a pharmacist or physician: None reported  Questions for the pharmacist: No    Confirmed patient received a Conservation officer, historic buildings and a Surveyor, mining with first shipment. The patient will receive a drug information handout for each medication shipped and additional FDA Medication Guides as required.       DISEASE/MEDICATION-SPECIFIC INFORMATION        N/A    SPECIALTY MEDICATION ADHERENCE     Medication Adherence    Patient reported X missed doses in the last month: 0  Specialty Medication: RINVOQ 15 mg Tb24 (upadacitinib)  Patient is on additional specialty medications: No              Were doses missed due to medication being on hold? No    Rinvoq 15 mg: 1 days of medicine on hand       REFERRAL TO PHARMACIST     Referral to the pharmacist: Not needed      [facility]     Shipping address confirmed in Epic.       Delivery Scheduled: Yes, Expected medication delivery date: 09/29/22.     Medication will be delivered via UPS to the prescription address in Epic [facility].    Annette Carpenter   [facility] Specialty Technician

## 2022-09-28 MED FILL — RINVOQ 15 MG TABLET,EXTENDED RELEASE: ORAL | 30 days supply | Qty: 30 | Fill #3

## 2022-10-02 DIAGNOSIS — L239 Allergic contact dermatitis, unspecified cause: Secondary | ICD-10-CM | POA: Diagnosis not present

## 2022-10-02 DIAGNOSIS — D2261 Melanocytic nevi of right upper limb, including shoulder: Secondary | ICD-10-CM | POA: Diagnosis not present

## 2022-10-02 DIAGNOSIS — L309 Dermatitis, unspecified: Secondary | ICD-10-CM | POA: Diagnosis not present

## 2022-10-02 DIAGNOSIS — Z79899 Other long term (current) drug therapy: Secondary | ICD-10-CM | POA: Diagnosis not present

## 2022-10-02 DIAGNOSIS — L718 Other rosacea: Secondary | ICD-10-CM | POA: Diagnosis not present

## 2022-10-02 DIAGNOSIS — L2089 Other atopic dermatitis: Secondary | ICD-10-CM | POA: Diagnosis not present

## 2022-10-07 DIAGNOSIS — J4540 Moderate persistent asthma, uncomplicated: Secondary | ICD-10-CM | POA: Diagnosis not present

## 2022-10-07 DIAGNOSIS — Z79899 Other long term (current) drug therapy: Secondary | ICD-10-CM | POA: Diagnosis not present

## 2022-10-07 DIAGNOSIS — I10 Essential (primary) hypertension: Secondary | ICD-10-CM | POA: Diagnosis not present

## 2022-10-07 DIAGNOSIS — L309 Dermatitis, unspecified: Secondary | ICD-10-CM | POA: Diagnosis not present

## 2022-10-21 NOTE — Unmapped (Signed)
[facility] Refill Coordination Note    Specialty Medication(s) to be Shipped:   Inflammatory Disorders: Rinvoq    Other medication(s) to be shipped: No additional medications requested for fill at this time     Maela Kisamore, DOB: 1976/01/20  Phone: [phone] (home)       All above HIPAA information was verified with patient.     Was a Nurse, learning disability used for this call? No    Completed refill call assessment today to schedule patient's medication shipment from the [facility] [phone]).  All relevant notes have been reviewed.     Specialty medication(s) and dose(s) confirmed: Regimen is correct and unchanged.   Changes to medications: Mihira reports no changes at this time.  Changes to insurance: No  New side effects reported not previously addressed with a pharmacist or physician: None reported  Questions for the pharmacist: No    Confirmed patient received a Conservation officer, historic buildings and a Surveyor, mining with first shipment. The patient will receive a drug information handout for each medication shipped and additional FDA Medication Guides as required.       DISEASE/MEDICATION-SPECIFIC INFORMATION        N/A    SPECIALTY MEDICATION ADHERENCE     Medication Adherence    Patient reported X missed doses in the last month: 0  Specialty Medication: RINVOQ 15 mg Tb24 (upadacitinib)              Were doses missed due to medication being on hold? No      RINVOQ 15 mg Tb24 (upadacitinib): 3 days of medicine on hand       REFERRAL TO PHARMACIST     Referral to the pharmacist: Not needed      [facility]     Shipping address confirmed in Epic.       Delivery Scheduled: Yes, Expected medication delivery date: 10/26/2022.     Medication will be delivered via UPS to the prescription address in Epic W[facility].    Craige Cotta   [facility] Specialty Technician

## 2022-10-23 MED FILL — RINVOQ 15 MG TABLET,EXTENDED RELEASE: ORAL | 30 days supply | Qty: 30 | Fill #4

## 2022-11-20 NOTE — Unmapped (Signed)
[facility] Refill Coordination Note    Specialty Medication(s) to be Shipped:   Inflammatory Disorders: Rinvoq    Other medication(s) to be shipped: No additional medications requested for fill at this time     Annette Carpenter, DOB: [birth year]  Phone: [phone] (home)       All above HIPAA information was verified with patient.     Was a Nurse, learning disability used for this call? No    Completed refill call assessment today to schedule patient's medication shipment from the [facility] [phone]).  All relevant notes have been reviewed.     Specialty medication(s) and dose(s) confirmed: Regimen is correct and unchanged.   Changes to medications: Annette Carpenter reports no changes at this time.  Changes to insurance: No  New side effects reported not previously addressed with a pharmacist or physician: None reported  Questions for the pharmacist: No    Confirmed patient received a Conservation officer, historic buildings and a Surveyor, mining with first shipment. The patient will receive a drug information handout for each medication shipped and additional FDA Medication Guides as required.       DISEASE/MEDICATION-SPECIFIC INFORMATION        N/A    SPECIALTY MEDICATION ADHERENCE     Medication Adherence    Patient reported X missed doses in the last month: 0  Specialty Medication: RINVOQ 15 mg Tb24 (upadacitinib)  Patient is on additional specialty medications: No  Informant: patient              Were doses missed due to medication being on hold? No      RINVOQ 15 mg Tb24 (upadacitinib): 1 days of medicine on hand       REFERRAL TO PHARMACIST     Referral to the pharmacist: Not needed      [facility]     Shipping address confirmed in Epic.       Delivery Scheduled: Yes, Expected medication delivery date: 11/24/2022.     Medication will be delivered via UPS to the prescription address in Epic WAM.    Annette Carpenter   [facility] Specialty Technician

## 2022-11-23 MED FILL — RINVOQ 15 MG TABLET,EXTENDED RELEASE: ORAL | 30 days supply | Qty: 30 | Fill #5

## 2022-12-16 NOTE — Unmapped (Signed)
[facility] Refill Coordination Note    Specialty Medication(s) to be Shipped:   Inflammatory Disorders: Rinvoq    Other medication(s) to be shipped: No additional medications requested for fill at this time     Annette Carpenter, DOB: Aug 15, 1975  Phone: [phone] (home)       All above HIPAA information was verified with patient.     Was a Nurse, learning disability used for this call? No    Completed refill call assessment today to schedule patient's medication shipment from the [facility] [phone]).  All relevant notes have been reviewed.     Specialty medication(s) and dose(s) confirmed: Regimen is correct and unchanged.   Changes to medications: Annette Carpenter reports no changes at this time.  Changes to insurance: No  New side effects reported not previously addressed with a pharmacist or physician: None reported  Questions for the pharmacist: No    Confirmed patient received a Conservation officer, historic buildings and a Surveyor, mining with first shipment. The patient will receive a drug information handout for each medication shipped and additional FDA Medication Guides as required.       DISEASE/MEDICATION-SPECIFIC INFORMATION        N/A    SPECIALTY MEDICATION ADHERENCE     Medication Adherence    Patient reported X missed doses in the last month: 0  Specialty Medication: RINVOQ 15 mg Tb24 (upadacitinib)  Patient is on additional specialty medications: No              Were doses missed due to medication being on hold? No      RINVOQ 15 mg Tb24 (upadacitinib): 5 days of medicine on hand       REFERRAL TO PHARMACIST     Referral to the pharmacist: Not needed      [facility]     Shipping address confirmed in Epic.       Delivery Scheduled: Yes, Expected medication delivery date: 12/23/2022.     Medication will be delivered via UPS to the prescription address in Epic [place].    Annette Carpenter J Helane Gunther   [facility] Specialty Technician

## 2022-12-22 MED FILL — RINVOQ 15 MG TABLET,EXTENDED RELEASE: ORAL | 30 days supply | Qty: 30 | Fill #6

## 2023-01-21 NOTE — Unmapped (Signed)
[facility] Delivery Pharmacy Refill Coordination Note    Specialty Medication(s) to be Shipped:   Inflammatory Disorders: Rinvoq 15mg     Other medication(s) to be shipped: No additional medications requested for fill at this time     Annette Carpenter, DOB: 26-Sep-1975  Phone: [phone] (home)       All above HIPAA information was verified with patient.     Was a Nurse, learning disability used for this call? No    Completed refill call assessment today to schedule patient's medication shipment from the [facility] Pharm[facility]  [phone]).  All relevant notes have been reviewed.     Specialty medication(s) and dose(s) confirmed: Regimen is correct and unchanged.   Changes to medications: Onita reports no changes at this time.  Changes to insurance: No  New side effects reported not previously addressed with a pharmacist or physician: None reported  Questions for the pharmacist: No    Confirmed patient received a Conservation officer, historic buildings and a Surveyor, mining with first shipment. The patient will receive a drug information handout for each medication shipped and additional FDA Medication Guides as required.       DISEASE/MEDICATION-SPECIFIC INFORMATION        N/A    SPECIALTY MEDICATION ADHERENCE     Medication Adherence    Patient reported X missed doses in the last month: 0  Specialty Medication: rinvoq  Patient is on additional specialty medications: No  Informant: patient              Were doses missed due to medication being on hold? No    Rinvoq 15 mg: 0 days of medicine on hand       REFERRAL TO PHARMACIST     Referral to the pharmacist: Not needed      [facility]     Shipping address confirmed in Epic.       Delivery Scheduled: Yes, Expected medication delivery date: 01/25/23.     Medication will be delivered via UPS to the prescription address in Epic W[facility].    Jasper Loser   [facility] Specialty and Home Delivery Pharmacy  Specialty Technician

## 2023-01-22 MED FILL — RINVOQ 15 MG TABLET,EXTENDED RELEASE: ORAL | 30 days supply | Qty: 30 | Fill #7

## 2023-02-17 NOTE — Unmapped (Signed)
[facility] Specialty and Home Delivery Pharmacy Refill Coordination Note    Specialty Medication(s) to be Shipped:   Inflammatory Disorders: Rinvoq 15mg     Other medication(s) to be shipped: No additional medications requested for fill at this time     Annette Carpenter, DOB: 1975-12-22  Phone: [phone] (home)       All above HIPAA information was verified with patient.     Was a Nurse, learning disability used for this call? No    Completed refill call assessment today to schedule patient's medication shipment from the [facility] Delivery Pharmacy  [phone]).  All relevant notes have been reviewed.     Specialty medication(s) and dose(s) confirmed: Regimen is correct and unchanged.   Changes to medications: Annette Carpenter reports no changes at this time.  Changes to insurance: No  New side effects reported not previously addressed with a pharmacist or physician: None reported  Questions for the pharmacist: No    Confirmed patient received a Conservation officer, historic buildings and a Surveyor, mining with first shipment. The patient will receive a drug information handout for each medication shipped and additional FDA Medication Guides as required.       DISEASE/MEDICATION-SPECIFIC INFORMATION        N/A    SPECIALTY MEDICATION ADHERENCE     Medication Adherence    Patient reported X missed doses in the last month: 0  Specialty Medication: RINVOQ 15 mg Tb24 (upadacitinib)  Patient is on additional specialty medications: No              Were doses missed due to medication being on hold? No    Rinvoq 15 mg: 06 days of medicine on hand       REFERRAL TO PHARMACIST     Referral to the pharmacist: Not needed      [facility]     Shipping address confirmed in Epic.       Delivery Scheduled: Yes, Expected medication delivery date: 02/19/23.     Medication will be delivered via UPS to the prescription address in Epic W[facility].    [facility] Pharmacy  Specialty Technician

## 2023-02-18 MED FILL — RINVOQ 15 MG TABLET,EXTENDED RELEASE: ORAL | 30 days supply | Qty: 30 | Fill #8

## 2023-02-22 DIAGNOSIS — L259 Unspecified contact dermatitis, unspecified cause: Secondary | ICD-10-CM | POA: Diagnosis not present

## 2023-02-22 DIAGNOSIS — Z1389 Encounter for screening for other disorder: Secondary | ICD-10-CM | POA: Diagnosis not present

## 2023-02-24 DIAGNOSIS — Z975 Presence of (intrauterine) contraceptive device: Secondary | ICD-10-CM | POA: Diagnosis not present

## 2023-02-24 DIAGNOSIS — I10 Essential (primary) hypertension: Secondary | ICD-10-CM | POA: Diagnosis not present

## 2023-02-24 DIAGNOSIS — Z124 Encounter for screening for malignant neoplasm of cervix: Secondary | ICD-10-CM | POA: Diagnosis not present

## 2023-02-24 DIAGNOSIS — Z01419 Encounter for gynecological examination (general) (routine) without abnormal findings: Secondary | ICD-10-CM | POA: Diagnosis not present

## 2023-02-25 DIAGNOSIS — L232 Allergic contact dermatitis due to cosmetics: Secondary | ICD-10-CM | POA: Diagnosis not present

## 2023-02-25 DIAGNOSIS — L233 Allergic contact dermatitis due to drugs in contact with skin: Secondary | ICD-10-CM | POA: Diagnosis not present

## 2023-02-25 DIAGNOSIS — L230 Allergic contact dermatitis due to metals: Secondary | ICD-10-CM | POA: Diagnosis not present

## 2023-03-17 NOTE — Unmapped (Signed)
[facility] Specialty and Home Delivery Pharmacy Refill Coordination Note    Specialty Medication(s) to be Shipped:   Inflammatory Disorders: Rinvoq    Other medication(s) to be shipped: No additional medications requested for fill at this time     Annette Carpenter, DOB: Feb 26, 1976  Phone: [phone] (home)       All above HIPAA information was verified with patient.     Was a Nurse, learning disability used for this call? No    Completed refill call assessment today to schedule patient's medication shipment from the [facility] and Home Delivery Pharmacy  [phone]).  All relevant notes have been reviewed.     Specialty medication(s) and dose(s) confirmed: Regimen is correct and unchanged.   Changes to medications: Annette Carpenter reports no changes at this time.  Changes to insurance: No  New side effects reported not previously addressed with a pharmacist or physician: None reported  Questions for the pharmacist: No    Confirmed patient received a Conservation officer, historic buildings and a Surveyor, mining with first shipment. The patient will receive a drug information handout for each medication shipped and additional FDA Medication Guides as required.       DISEASE/MEDICATION-SPECIFIC INFORMATION        N/A    SPECIALTY MEDICATION ADHERENCE     Medication Adherence    Patient reported X missed doses in the last month: 0  Specialty Medication: upadacitinib (RINVOQ) 15 mg Tb24  Patient is on additional specialty medications: No  Patient is on more than two specialty medications: No  Informant: patient              Were doses missed due to medication being on hold? No    Rinvoq 15 mg: 7 doses of medicine on hand       REFERRAL TO PHARMACIST     Referral to the pharmacist: Not needed      SHIPPING     Shipping address confirmed in Epic.       Delivery Scheduled: Yes, Expected medication delivery date: 03/24/23.     Medication will be delivered via UPS to the prescription address in Epic WAM.    Sherral Hammers, PharmD   [facility] Specialty and Home Delivery Pharmacy  Specialty Pharmacist

## 2023-03-23 MED FILL — RINVOQ 15 MG TABLET,EXTENDED RELEASE: ORAL | 30 days supply | Qty: 30 | Fill #9

## 2023-03-26 DIAGNOSIS — L309 Dermatitis, unspecified: Secondary | ICD-10-CM | POA: Diagnosis not present

## 2023-03-26 DIAGNOSIS — Z79899 Other long term (current) drug therapy: Secondary | ICD-10-CM | POA: Diagnosis not present

## 2023-03-26 DIAGNOSIS — L718 Other rosacea: Secondary | ICD-10-CM | POA: Diagnosis not present

## 2023-03-26 DIAGNOSIS — L2089 Other atopic dermatitis: Secondary | ICD-10-CM | POA: Diagnosis not present

## 2023-03-29 MED ORDER — RINVOQ 30 MG TABLET,EXTENDED RELEASE
ORAL_TABLET | Freq: Every day | ORAL | 11 refills | 30.00 days
Start: 2023-03-29 — End: ?

## 2023-03-30 NOTE — Unmapped (Signed)
Clinical Assessment Needed For: Dose Change  Medication: RINVOQ 30 mg tablet (upadacitinib)  Last Fill Date/Day Supply: 03/22/23 / 30 days  Copay $0  Was previous dose already scheduled to fill: No    Notes to Pharmacist:

## 2023-04-01 NOTE — Unmapped (Signed)
[facility] Pharmacist has reviewed a new prescription for Rinvoq that indicates a dose increase.  Patient was counseled on this dosage change by MD Annette Carpenter- see epic note from N/A confirmed with patient.  Next refill call date adjusted if necessary.    [name] Refill Coordination Note    Specialty Medication(s) to be Shipped:   Inflammatory Disorders: Rinvoq    Other medication(s) to be shipped: No additional medications requested for fill at this time     Annette Carpenter, DOB: 07-20-1975  Phone: [phone] (home)       All above HIPAA information was verified with patient.     Was a Nurse, learning disability used for this call? No    Completed refill call assessment today to schedule patient's medication shipment from the [facility]  [phone]).  All relevant notes have been reviewed.     Specialty medication(s) and dose(s) confirmed: Regimen is correct and unchanged.   Changes to medications: Annette Carpenter reports no changes at this time.  Changes to insurance: No  New side effects reported not previously addressed with a pharmacist or physician: None reported  Questions for the pharmacist: No    Confirmed patient received a Conservation officer, historic buildings and a Surveyor, mining with first shipment. The patient will receive a drug information handout for each medication shipped and additional FDA Medication Guides as required.       DISEASE/MEDICATION-SPECIFIC INFORMATION        N/A    SPECIALTY MEDICATION ADHERENCE     Medication Adherence    Patient reported X missed doses in the last month: 0  Specialty Medication: upadacitinib (RINVOQ) 30 mg tablet  Patient is on additional specialty medications: No  Patient is on more than two specialty medications: No  Informant: patient              Were doses missed due to medication being on hold? No    Rinvoq 15 mg: 7 doses of medicine on hand       REFERRAL TO PHARMACIST     Referral to the pharmacist: Not needed      SHIPPING     Shipping address confirmed in Epic.       Delivery Scheduled: Yes, Expected medication delivery date: 04/06/23.     Medication will be delivered via UPS to the prescription address in Epic WAM.    Annette Carpenter, PharmD   [facility]  Specialty Pharmacist

## 2023-04-05 MED FILL — RINVOQ 30 MG TABLET,EXTENDED RELEASE: ORAL | 30 days supply | Qty: 30 | Fill #0

## 2023-04-13 NOTE — Unmapped (Signed)
[facility] - Replacement Package    Annette Carpenter 's package containing Rinvoq is being replaced due to lost in transit.    UPS Tracking Number: [id number]    Replacement package approved and scheduled for delivery via UPS to the prescription address in Epic WAM on 04/26/23

## 2023-04-23 MED FILL — RINVOQ 30 MG TABLET,EXTENDED RELEASE: ORAL | 30 days supply | Qty: 30 | Fill #1

## 2023-05-20 ENCOUNTER — Ambulatory Visit: Payer: BC Managed Care – PPO | Admitting: Allergy & Immunology

## 2023-05-20 ENCOUNTER — Other Ambulatory Visit: Payer: Self-pay

## 2023-05-20 ENCOUNTER — Encounter: Payer: Self-pay | Admitting: Allergy & Immunology

## 2023-05-20 VITALS — BP 124/84 | HR 70 | Temp 98.4°F | Resp 16 | Ht 63.78 in | Wt 138.2 lb

## 2023-05-20 DIAGNOSIS — J302 Other seasonal allergic rhinitis: Secondary | ICD-10-CM

## 2023-05-20 DIAGNOSIS — L253 Unspecified contact dermatitis due to other chemical products: Secondary | ICD-10-CM

## 2023-05-20 DIAGNOSIS — J3089 Other allergic rhinitis: Secondary | ICD-10-CM

## 2023-05-20 DIAGNOSIS — L309 Dermatitis, unspecified: Secondary | ICD-10-CM

## 2023-05-20 DIAGNOSIS — B999 Unspecified infectious disease: Secondary | ICD-10-CM

## 2023-05-20 DIAGNOSIS — J4530 Mild persistent asthma, uncomplicated: Secondary | ICD-10-CM

## 2023-05-20 DIAGNOSIS — T7805XD Anaphylactic reaction due to tree nuts and seeds, subsequent encounter: Secondary | ICD-10-CM

## 2023-05-20 DIAGNOSIS — T7801XD Anaphylactic reaction due to peanuts, subsequent encounter: Secondary | ICD-10-CM

## 2023-05-20 DIAGNOSIS — L308 Other specified dermatitis: Secondary | ICD-10-CM | POA: Diagnosis not present

## 2023-05-20 NOTE — Patient Instructions (Addendum)
1. Severe eczema - Consider changing to another biologic such as Nemluvio (addresses the itching much more effectively) or Ebglyss. - Handouts provided.   2. Contact dermatitis due to chemicals - Continue to avoid all of your triggering chemicals.  3. Seasonal and perennial allergic rhinitis - We will recheck your environmental allergens via the blood. - We can come up with a better plan at that time once we have the results back.   4. Recurrent infections - We will obtain some screening labs to evaluate your immune system.  - Labs to evaluate the quantitative [facility]) aspects of your immune system: IgG/IgA/IgM, CBC with differential - Labs to evaluate the qualitative (HOW WELL THEY WORK) aspects of your immune system: CH50, Pneumococcal titers, Tetanus titers, Diphtheria titers - We may consider immunizations with Pneumovax and Tdap to challenge your immune system, and then obtain repeat titers in 4-6 weeks.  - I could send genetic testing, but let's see what this shows. - I do not want you to have a large bill at the end of this.   5. 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 - EpiPen is up to date.   6. Mild persistent asthma, uncomplicated - Lung testing looks good today. - Sample of AirSupra provided today. - AirSupra contains albuterol plus an inhaled steroid to help the albuterol work better. - There is a $0 copay card for this medication.  7. Return in about 3 months (around 08/18/2023). You can have the follow up appointment with Dr. Dellis Anes or a Nurse Practicioner (our Nurse Practitioners are excellent and always have Physician oversight!).    Please inform us of any Emergency Department visits, hospitalizations, or changes in symptoms. Call us before going to the ED for breathing or allergy symptoms since we might be able to fit you in for a sick visit. Feel  free to contact us anytime with any questions, problems, or concerns.  It was a pleasure to meet you today!  Websites that have reliable patient information: 1. American Academy of Asthma, Allergy, and Immunology: www.aaaai.org 2. Food Allergy Research and Education (FARE): foodallergy.org 3. Mothers of Asthmatics: http://www.asthmacommunitynetwork.org 4. American College of Allergy, Asthma, and Immunology: www.acaai.org      "Like" [place] on Facebook and Instagram for our latest updates!      A healthy democracy works best when [employer] participate! Make sure you are registered to vote! If you have moved or changed any of your contact information, you will need to get this updated before voting! Scan the QR codes below to learn more!

## 2023-05-20 NOTE — Progress Notes (Signed)
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  It certainly is not the autosomal dominant form, as I would have expected more skeletal abnormalities and severe infections as a child.  We are going to get an IgE and see where that level is hanging out.  There is an autosomal recessive form of hyper IgE, but these patients typically have severe viral infections and have some neurological abnormalities as well.  We will see what her immune workup shows.  We did not order genetic testing today, we can certainly consider that if needed in the future.  I think he may benefit from one of the newer Biologics for atopic dermatitis.  We gave her some information on both Nemluvio and Ebglyss and she is going to consider transitioning to these.     Plan/Recommendations:   1. Severe eczema - Consider changing to another biologic such as Nemluvio (addresses the itching much more effectively) or Ebglyss. - Handouts provided.   2. Contact dermatitis due to chemicals - Continue to avoid all of your triggering chemicals.  3. Seasonal and perennial allergic rhinitis - We will recheck your environmental allergens via the blood. - We can come up with a better plan at that time once we have the results back.   4. Recurrent infections - We will obtain some screening labs to evaluate your immune system.  - Labs to evaluate the quantitative [facility]) aspects of your immune system:  IgG/IgA/IgM, CBC with differential - Labs to evaluate the qualitative (HOW WELL THEY WORK) aspects of your immune system: CH50, Pneumococcal titers, Tetanus titers, Diphtheria titers - We may consider immunizations with Pneumovax and Tdap to challenge your immune system, and then obtain repeat titers in 4-6 weeks.  - I could send genetic testing, but let's see what this shows. - I do not want you to have a large bill at the end of this.   5. 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 - EpiPen is up to date.   6. Mild persistent asthma, uncomplicated - Lung testing looks good today. - Sample of AirSupra provided today. - AirSupra contains albuterol plus an inhaled steroid to help the albuterol work better. - There is a $0 copay card for this medication.  7. Return in about 3 months (around 08/18/2023). You can have the follow up appointment with Dr. Dellis Anes or a Nurse Practicioner (our Nurse Practitioners are excellent and always have Physician oversight!).     This note in its entirety was forwarded to the Provider who requested this consultation.  Subjective:   Cindy Huynh is a 48 y.o. female presenting today for evaluation of  Chief Complaint  Patient presents with   Eczema    Has Hyper-IgE Syndrome. Has eczema all over her body and has been on different medications and states that they work for a while until it stops working. Has multiple allergies.    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  Discussed the use of AI scribe software for clinical note transcription with the patient and/or guardian, who gave verbal consent to proceed.  Cindy Huynh was referred by Darrow Bussing, MD and Dr. Theador Hawthorne.   Cindy Huynh is a 48 y.o. female presenting for an evaluation of concern for hyper IgE syndrome .  Cindy Huynh has a history of severe eczema and hyper IgE syndrome, presents with persistent skin issues and allergies. She was referred by Dr. Roderic Scarce for evaluation of her severe eczema and possible allergic reactions.   Asthma/Respiratory Symptom History: She has a history of asthma, previously managed with Symbicort, which was discontinued after starting Dupixent. She currently uses an inhaler three to four times a week, mostly in the morning. She smokes about a pack a day and experiences morning cough but no significant nocturnal symptoms.  She has never seen a pulmonologist.  Allergic Rhinitis Symptom History: She does report having environmental allergies including pollens as well as dust, molds, and animals.  She was on allergy shots for a number of years, from age 49 or 28 until age 56.  Food Allergy Symptom History: She is has multiple allergies, including tree nuts, peanuts, avocado, and seafood, with two anaphylactic reactions: one after consuming pesto with pine nuts and another possibly due to a shrimp egg roll.  She probably needs an updated EpiPen.  She estimates she started having food allergies in her 54s.  She was tested for foods around age 38.  She shares that she reacted so severely that they had to wait 3 more weeks and then have another 77 test on her arms.  Skin Symptom History: She has a long-standing history of severe eczema, which began at [age] weeks of age. She has experienced raw spots from  scratching since childhood and has tried various treatments over the years. Dupixent was effective for eight months before causing a reaction with thick, scaly plaques on her back, leading to its discontinuation and a return of systemic itching. Cibinqo initially provided relief but later lost efficacy, and she experienced weight gain. Currently, she is on Rinvoq, having increased the dose from 15 mg to 30 mg, but still experiences random eczema spots. She avoids these allergens and uses hydrocortisone ointment and Aveeno Eczema Care for skin care, along with bleach baths when her skin is inflamed.  Her dermatologist has recommended that she do footed PJs and gloves, but she does not do this.  She had patch testing done at [facility].  She was positive to Gold sodium thiosulfate, Cocamidopropyl Betaine, Decyl glucoside, and Pramoxine hydrochloride.   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 She recalls hospitalizations for pneumonia and cellulitis twice before COVID. No recent immune workup has been conducted since childhood. She has not been to the hospital for any infections since childhood, if at  all.  She tells me this was diagnosed by her pediatrician when she was a child.  Her last severe infections were when she had pneumonia when she was in college.  She has had a number of staph infections due to her history of eczema.  She has not remember any particular immune workup.  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  Her other chest x-rays just showed peribronchial thickening without focal airspace disease or were normal.  Of note, she has no history of retained primary teeth or other dental and skeletal abnormalities.   Her allergies are from her dad side.  Her dad had a number of environmental allergies as well as  eczema.  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 She moved to the area in 2002 and has family in [place] but is the only immediate family member in [place].   Otherwise, there is no history of other atopic diseases, including stinging insect allergies. Vaccinations are up to date.    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 If you have any questions, ask your nurse or doctor.          STOP taking these medications    diphenhydrAMINE 25 mg capsule Commonly known as: BENADRYL Stopped by: Alfonse Spruce   EPINEPHrine 0.3 mg/0.3 mL Devi Commonly known as: EpiPen Stopped by: Alfonse Spruce   famotidine 20 MG tablet Commonly known as: PEPCID Stopped by: Alfonse Spruce   fluocinonide gel 0.05 % Commonly known as: LIDEX Stopped by: Alfonse Spruce   guaiFENesin 600 MG 12 hr tablet Commonly known as: MUCINEX Stopped by: Alfonse Spruce   loratadine 10 MG tablet Commonly known as: CLARITIN Stopped by: Alfonse Spruce   predniSONE 20 MG tablet Commonly known as: DELTASONE Stopped by: Alfonse Spruce       TAKE these medications    cetirizine 10 MG tablet Commonly known as: ZYRTEC Take 10 mg by mouth daily.   doxycycline 100 MG tablet Commonly known as: VIBRA-TABS Take 100 mg by mouth daily.   eucerin cream Apply 1 application topically 3 (three) times daily as needed for dry skin.   hydrochlorothiazide 25 MG tablet Commonly known as: HYDRODIURIL Take 1 tablet (25 mg total) by mouth daily.   hydrocortisone 2.5 % ointment APPLY TO THE AFFECTED AREA DAILY AS DIRECTED   Mirena (52 MG) 20 MCG/DAY Iud  Generic drug: levonorgestrel 1 each by Intrauterine route once.   Rinvoq 30 MG Tb24 Generic drug: Upadacitinib ER Take 1 tablet by mouth daily.   Symbicort 160-4.5 MCG/ACT inhaler Generic drug: budesonide-formoterol INHALE 2 PUFFS INTO THE LUNGS TWICE DAILY   Ventolin HFA 108 (90 Base) MCG/ACT inhaler Generic drug: albuterol INHALE 2 PUFFS INTO THE LUNGS EVERY 6 HOURS AS NEEDED FOR WHEEZING        Birth History: non-contributory  Developmental History: non-contributory  Past Surgical History: Past Surgical History:  Procedure Laterality Date   none     OVARIAN CYST DRAINAGE       Family History: Family History  Problem Relation Age of Onset   Eczema Father    Allergic rhinitis Father      Social History: Cindy Huynh lives at home with boyfriend.  She lives in a house that is old.  It was built in 1946.  There is no current water damage.  There was some mold earlier, but it was remediated.  She has hardwood throughout the home.  She has electric heating and central cooling.  She has a dog in the home.  There is a cat outside of the home.  There are no dust mite covers on the bedding.  There is tobacco exposure.  She currently works as a Librarian, academic for almost 10 years.  There is no fume, chemical, or dust exposure.  There is no HEPA filter. Her boyfriend  is a Corporate investment banker and he does change his clothes and wash up when he comes into the home.    Review of systems otherwise negative other than that mentioned in the HPI.    Objective:   Blood pressure 124/84, pulse 70, temperature 98.4 F (36.9 C), temperature source Temporal, resp. rate 16, height 5' 3.78" (1.62 m), weight 138 lb 3.2 oz (62.7 kg), SpO2 98%. Body mass index is 23.89 kg/m.     Physical Exam Vitals reviewed.  Constitutional:      Appearance: She is well-developed.     Comments: Talkative and good sense of humor.  HENT:     Head: Normocephalic and atraumatic.     Right Ear: Tympanic membrane, ear canal and external ear normal. No drainage, swelling or tenderness. Tympanic membrane is not injected, scarred, erythematous, retracted or bulging.     Left Ear: Tympanic membrane, ear canal and external ear normal. No drainage, swelling or tenderness. Tympanic membrane is not injected, scarred, erythematous, retracted or bulging.     Nose: Mucosal edema and rhinorrhea present. No nasal deformity or septal deviation.     Right Turbinates: Enlarged, swollen and pale.     Left Turbinates: Enlarged, swollen and pale.     Right Sinus: No maxillary sinus tenderness or frontal sinus tenderness.     Left Sinus: No maxillary sinus tenderness or frontal sinus tenderness.     Mouth/Throat:     Mouth: Mucous membranes are not pale and not dry.     Pharynx: Uvula midline.  Eyes:     General:        Right eye: No discharge.        Left eye: No discharge.     Conjunctiva/sclera: Conjunctivae normal.     Right eye: Right conjunctiva is not injected. No chemosis.    Left eye: Left conjunctiva is not injected. No chemosis.    Pupils: Pupils are equal, round, and reactive to light.  Cardiovascular:     Rate and Rhythm: Normal rate and regular rhythm.  Heart sounds: Normal heart sounds.  Pulmonary:     Effort: Pulmonary effort is normal. No tachypnea, accessory muscle usage  or respiratory distress.     Breath sounds: Normal breath sounds. No wheezing, rhonchi or rales.  Chest:     Chest wall: No tenderness.  Abdominal:     Tenderness: There is no abdominal tenderness. There is no guarding or rebound.  Lymphadenopathy:     Head:     Right side of head: No submandibular, tonsillar or occipital adenopathy.     Left side of head: No submandibular, tonsillar or occipital adenopathy.     Cervical: No cervical adenopathy.  Skin:    General: Skin is warm.     Capillary Refill: Capillary refill takes less than 2 seconds.     Coloration: Skin is not pale.     Findings: Rash present. No abrasion, erythema or petechiae. Rash is not papular, urticarial or vesicular.     Comments: She does have some thickened skin on her bilateral arms. She also has some eczematous lesions on her arms, neck, and cheeks. No honey-crusting or oozing noted.   Neurological:     Mental Status: She is alert.  Psychiatric:        Behavior: Behavior is cooperative.      Diagnostic studies:    Spirometry: results normal (FEV1: 2.40/89%, FVC: 4.16/124%, FEV1/FVC: 58%).    Spirometry consistent with normal pattern.   Allergy Studies: none          Cindy Bonds, MD [facility]

## 2023-05-29 LAB — ALLERGENS W/COMP RFLX AREA 2

## 2023-05-29 LAB — IGE NUT PROF. W/COMPONENT RFLX

## 2023-05-31 ENCOUNTER — Telehealth: Payer: Self-pay | Admitting: *Deleted

## 2023-05-31 NOTE — Telephone Encounter (Signed)
-----   Message from [place] Chuck sent at 05/22/2023  5:27 PM EST ----- Nemluvio perhaps?

## 2023-05-31 NOTE — Telephone Encounter (Signed)
 L/M for patient to advise approval for [facility] and submit for same

## 2023-06-01 LAB — IGE NUT PROF. W/COMPONENT RFLX
F017-IgE Hazelnut (Filbert): 42.9 kU/L — AB
F018-IgE Brazil Nut: 23.7 kU/L — AB
F202-IgE Cashew Nut: 23.1 kU/L — AB
F202-IgE Cashew Nut: 58.8 kU/L — AB
F256-IgE Walnut: 8.95 kU/L — AB
Jug R 3 IgE: 39.5 kU/L — AB
Macadamia Nut, IgE: 7.14 kU/L — AB
Peanut, IgE: 44 kU/L — AB
Pecan Nut IgE: 3.05 kU/L — AB

## 2023-06-01 LAB — STREP PNEUMONIAE 23 SEROTYPES IGG
Pneumo Ab Type 1*: <0.1 ug/mL — ABNORMAL LOW (ref >1.3)
Pneumo Ab Type 12 (12F)*: 0.2 ug/mL — ABNORMAL LOW (ref >1.3)
Pneumo Ab Type 14*: <0.1 ug/mL — ABNORMAL LOW (ref >1.3)
Pneumo Ab Type 17 (17F)*: 0.3 ug/mL — ABNORMAL LOW (ref >1.3)
Pneumo Ab Type 19 (19F)*: 0.8 ug/mL — ABNORMAL LOW (ref >1.3)
Pneumo Ab Type 2*: 0.5 ug/mL — ABNORMAL LOW (ref >1.3)
Pneumo Ab Type 20*: 1.8 ug/mL (ref >1.3)
Pneumo Ab Type 22 (22F)*: <0.1 ug/mL — ABNORMAL LOW (ref >1.3)
Pneumo Ab Type 23 (23F)*: 0.2 ug/mL — ABNORMAL LOW (ref >1.3)
Pneumo Ab Type 26 (6B)*: <0.1 ug/mL — ABNORMAL LOW (ref >1.3)
Pneumo Ab Type 3*: <0.1 ug/mL — ABNORMAL LOW (ref >1.3)
Pneumo Ab Type 34 (10A)*: 0.5 ug/mL — ABNORMAL LOW (ref >1.3)
Pneumo Ab Type 4*: <0.1 ug/mL — ABNORMAL LOW (ref >1.3)
Pneumo Ab Type 43 (11A)*: 0.2 ug/mL — ABNORMAL LOW (ref >1.3)
Pneumo Ab Type 5*: 0.7 ug/mL — ABNORMAL LOW (ref >1.3)
Pneumo Ab Type 51 (7F)*: 1.1 ug/mL — ABNORMAL LOW (ref >1.3)
Pneumo Ab Type 54 (15B)*: 0.4 ug/mL — ABNORMAL LOW (ref >1.3)
Pneumo Ab Type 56 (18C)*: 0.1 ug/mL — ABNORMAL LOW (ref >1.3)
Pneumo Ab Type 57 (19A)*: 1.5 ug/mL (ref >1.3)
Pneumo Ab Type 68 (9V)*: 0.2 ug/mL — ABNORMAL LOW (ref >1.3)
Pneumo Ab Type 70 (33F)*: 0.3 ug/mL — ABNORMAL LOW (ref >1.3)
Pneumo Ab Type 8*: 0.6 ug/mL — ABNORMAL LOW (ref >1.3)
Pneumo Ab Type 9 (9N)*: 0.2 ug/mL — ABNORMAL LOW (ref >1.3)

## 2023-06-01 LAB — PANEL 604726
Cor A 1 IgE: 0.43 kU/L — AB
Cor A 14 IgE: 0.73 kU/L — AB
Cor A 8 IgE: 1.07 kU/L — AB
Cor A 9 IgE: 95.7 kU/L — AB

## 2023-06-01 LAB — CBC WITH DIFFERENTIAL/PLATELET
Basophils Absolute: 0 10*3/uL (ref 0.0–0.2)
Basos: 1 %
EOS (ABSOLUTE): 0.5 10*3/uL — ABNORMAL HIGH (ref 0.0–0.4)
Eos: 7 %
Hematocrit: 41 % (ref 34.0–46.6)
Hemoglobin: 15.3 g/dL (ref 11.1–15.9)
Immature Grans (Abs): 0 10*3/uL (ref 0.0–0.1)
Immature Granulocytes: 0 %
Lymphocytes Absolute: 1.5 10*3/uL (ref 0.7–3.1)
Lymphs: 23 %
MCH: 38.4 pg — ABNORMAL HIGH (ref 26.6–33.0)
MCHC: 37.3 g/dL — ABNORMAL HIGH (ref 31.5–35.7)
MCV: 103 fL — ABNORMAL HIGH (ref 79–97)
Monocytes Absolute: 0.4 10*3/uL (ref 0.1–0.9)
Monocytes: 6 %
Neutrophils Absolute: 4 10*3/uL (ref 1.4–7.0)
Neutrophils: 63 %
Platelets: 369 10*3/uL (ref 150–450)
RBC: 3.98 x10E6/uL (ref 3.77–5.28)
RDW: 12.1 % (ref 11.7–15.4)
WBC: 6.4 10*3/uL (ref 3.4–10.8)

## 2023-06-01 LAB — PEANUT COMPONENTS
F352-IgE Ara h 8: 1.63 kU/L — AB
F422-IgE Ara h 1: 10.8 kU/L — AB
F423-IgE Ara h 2: 30.5 kU/L — AB
F424-IgE Ara h 3: 5.37 kU/L — AB
F427-IgE Ara h 9: 16.8 kU/L — AB
F447-IgE Ara h 6: 23.2 kU/L — AB

## 2023-06-01 LAB — ALLERGENS W/COMP RFLX AREA 2
Bermuda Grass IgE: 1.71 kU/L — AB
Cockroach, German IgE: 3.83 kU/L — AB
Cottonwood IgE: 9.46 kU/L — AB
D Farinae IgE: 44.6 kU/L — AB
D Pteronyssinus IgE: 53.9 kU/L — AB
E101-IgE Can f 1: 1.63 kU/L — AB
E220-IgE Fel d 2: 3.62 kU/L — AB
E221-IgE Can f 3: 2.95 kU/L — AB
E226-IgE Can f 5: 3.46 kU/L — AB
Elm, American IgE: 8.66 kU/L — AB
Johnson Grass IgE: 17.9 kU/L — AB
Johnson Grass IgE: 2.41 kU/L — AB
Mouse Urine IgE: 6.07 kU/L — AB
Pecan, Hickory IgE: 49 kU/L — AB
Penicillium Chrysogen IgE: 1.78 kU/L — AB
Penicillium Chrysogen IgE: 6.34 kU/L — AB
Pigweed, Rough IgE: 7.51 kU/L — AB
Ragweed, Short IgE: 19.5 kU/L — AB
Sheep Sorrel IgE Qn: 7.85 kU/L — AB
Timothy Grass IgE: 14.4 kU/L — AB
Timothy Grass IgE: 8.02 kU/L — AB
White Mulberry IgE: 1.64 kU/L — AB

## 2023-06-01 LAB — DIPHTHERIA / TETANUS ANTIBODY PANEL
Diphtheria Ab: 0.16 [IU]/mL (ref <0.10)
Tetanus Ab, IgG: 1.04 [IU]/mL (ref <0.10)

## 2023-06-01 LAB — PANEL 604721
Jug R 1 IgE: 3.43 kU/L — AB
Jug R 3 IgE: 21.4 kU/L — AB

## 2023-06-01 LAB — PANEL 606578
E094-IgE Fel d 1: 68.4 kU/L — AB
E220-IgE Fel d 2: 85.3 kU/L — AB
E228-IgE Fel d 4: 45.6 kU/L — AB

## 2023-06-01 LAB — PANEL 606648
E101-IgE Can f 1: >100 kU/L — AB
E102-IgE Can f 2: >100 kU/L — AB
E221-IgE Can f 3: >100 kU/L — AB
E226-IgE Can f 5: 22.2 kU/L — AB

## 2023-06-01 LAB — PANEL 604350: Ber E 1 IgE: 0.75 kU/L — AB

## 2023-06-01 LAB — COMPLEMENT, TOTAL: Compl, Total (CH50): 53 U/mL (ref >41)

## 2023-06-01 LAB — IGG, IGA, IGM
IgA/Immunoglobulin A, Serum: 225 mg/dL (ref 87–352)
IgG (Immunoglobin G), Serum: 818 mg/dL (ref 586–1602)
IgM (Immunoglobulin M), Srm: 16 mg/dL — ABNORMAL LOW (ref 26–217)

## 2023-06-01 LAB — PANEL 604239: ANA O 3 IgE: 1.47 kU/L — AB

## 2023-06-01 LAB — ALLERGEN, STRAWBERRY, F44: Allergen Strawberry IgE: 7.59 kU/L — AB

## 2023-06-01 LAB — TRYPTASE: Tryptase: 3.8 ug/L (ref 2.2–13.2)

## 2023-06-01 LAB — ALLERGEN COMPONENT COMMENTS

## 2023-06-01 LAB — ALLERGEN AVOCADO F96: F096-IgE Avocado: 7.12 kU/L — AB

## 2023-06-04 ENCOUNTER — Encounter: Payer: Self-pay | Admitting: Allergy & Immunology

## 2023-06-04 NOTE — Unmapped (Signed)
 [facility] Refill Coordination Note    Specialty Medication(s) to be Shipped:   Inflammatory Disorders: Rinvoq    Other medication(s) to be shipped: No additional medications requested for fill at this time     Annette Carpenter, DOB: [birth year]  Phone: [phone] (home)       All above HIPAA information was verified with patient.     Was a Nurse, learning disability used for this call? No    Completed refill call assessment today to schedule patient's medication shipment from the [facility]  [phone]).  All relevant notes have been reviewed.     Specialty medication(s) and dose(s) confirmed: Regimen is correct and unchanged.   Changes to medications: Annette Carpenter reports no changes at this time.  Changes to insurance: No  New side effects reported not previously addressed with a pharmacist or physician: None reported  Questions for the pharmacist: No    Confirmed patient received a Conservation officer, historic buildings and a Surveyor, mining with first shipment. The patient will receive a drug information handout for each medication shipped and additional FDA Medication Guides as required.       DISEASE/MEDICATION-SPECIFIC INFORMATION        N/A    SPECIALTY MEDICATION ADHERENCE     Medication Adherence    Patient reported X missed doses in the last month: 0  Specialty Medication: RINVOQ 30 mg tablet (upadacitinib)  Patient is on additional specialty medications: No  Patient is on more than two specialty medications: No  Informant: patient              Were doses missed due to medication being on hold? No    Rinvoq 30 mg: 5-7  days of medicine on hand       REFERRAL TO PHARMACIST     Referral to the pharmacist: Not needed      [facility]     Shipping address confirmed in Epic.       Delivery Scheduled: Yes, Expected medication delivery date: 06/08/23.     Medication will be delivered via UPS to the prescription address in Epic WAM.    Sherral Hammers, PharmD   [facility]  Specialty Pharmacist

## 2023-06-07 DIAGNOSIS — L209 Atopic dermatitis, unspecified: Principal | ICD-10-CM

## 2023-06-07 NOTE — Unmapped (Signed)
 Annette Carpenter 's Rinvoq shipment will be delayed as a result of prior authorization being required by the patient's insurance.     I have reached out to the patient  at [phone]  and communicated the delay. We will call the patient back to reschedule the delivery upon resolution. We have not confirmed the new delivery date.

## 2023-06-07 NOTE — Telephone Encounter (Signed)
 Patient called and advised approval and submit to [facility] for [facility] with instruction on delivery, storage, dosing and initial loading dose in clinic with appt

## 2023-06-08 NOTE — Unmapped (Signed)
 Galya Bergsma 's Rinvoq shipment will be canceled as a result of referral closed, prescriber's office [facility]) informed MAP Specialist that patient is no longer on the requested medication, so no PA is required.    I have reached out to the patient  at [phone]  and left a voicemail message.  We will not reschedule the medication and have removed this/these medication(s) from the work request.  We have canceled this work request.

## 2023-06-08 NOTE — Telephone Encounter (Signed)
Awesome sauce!  

## 2023-06-09 NOTE — Unmapped (Signed)
 Specialty Medication(s): Rinvoq    Annette Carpenter has been dis-enrolled from the [employer] and [facility] specialty pharmacy services due to  Provider has informed out MAPs team that the patient is no longer on the medication .    Additional information provided to the patient: N/A    Sherral Hammers, PharmD  [facility] Specialty and Home Delivery Pharmacy Specialty Pharmacist

## 2023-06-14 MED ORDER — NEMLUVIO 30 MG ~~LOC~~ AUIJ: AUTO-INJECTOR | SUBCUTANEOUS | 11 refills | Status: DC

## 2023-06-14 NOTE — Addendum Note (Signed)
 Addended by: Devoria Glassing on: 06/14/2023 05:01 PM   Modules accepted: Orders

## 2023-06-25 ENCOUNTER — Telehealth: Payer: Self-pay | Admitting: Allergy & Immunology

## 2023-06-25 NOTE — Telephone Encounter (Signed)
 [facility] REQUESTED BLOOD WORK RESULTS FOR PT, THE NOTES THEY HAVE MOST OF THE BLOOD WORK WAS PENDING. FAX # [phone].

## 2023-06-25 NOTE — Telephone Encounter (Signed)
 Faxed all lab results with Dr Dellis Anes thru epic to [facility] dermatology

## 2023-06-27 DIAGNOSIS — R5383 Other fatigue: Secondary | ICD-10-CM | POA: Diagnosis not present

## 2023-06-27 DIAGNOSIS — R509 Fever, unspecified: Secondary | ICD-10-CM | POA: Diagnosis not present

## 2023-06-27 DIAGNOSIS — J101 Influenza due to other identified influenza virus with other respiratory manifestations: Secondary | ICD-10-CM | POA: Diagnosis not present

## 2023-06-27 DIAGNOSIS — R52 Pain, unspecified: Secondary | ICD-10-CM | POA: Diagnosis not present

## 2023-06-30 ENCOUNTER — Telehealth: Payer: Self-pay | Admitting: Allergy & Immunology

## 2023-06-30 DIAGNOSIS — R768 Other specified abnormal immunological findings in serum: Secondary | ICD-10-CM

## 2023-06-30 NOTE — Telephone Encounter (Signed)
 [facility] called for Dr. Dellis Anes to call Dr. Theador Hawthorne at [phone] about pt

## 2023-07-01 NOTE — Telephone Encounter (Signed)
 I contacted Dr. Roderic Scarce on my cell and am awaiting a call back!   Malachi Bonds, MD [facility]

## 2023-07-02 ENCOUNTER — Encounter: Payer: Self-pay | Admitting: Allergy & Immunology

## 2023-07-02 NOTE — Telephone Encounter (Signed)
 Dr. Roderic Scarce and I finally connected.  She was wondering whether Xolair might be a better option than Nemluvio, given the markedly elevated IgE level.  I am not convinced that Xolair will help much with the skin, as she is not really having hives.  However, there is a theoretical basis for using it as a means of immunomodulation since he does neutralize IgE antibodies.  I am not opposed to it.  We are going to get genetic testing to look for hyper IgE syndrome.  This was ordered through [facility] and I sent a message to the patient explaining our decision-making process.  However amended that she come to her office to get this lab collected and sent.  Malachi Bonds, MD [facility]

## 2023-07-02 NOTE — Addendum Note (Signed)
 Addended by: Alfonse Spruce on: 07/02/2023 04:29 PM   Modules accepted: Orders

## 2023-07-05 NOTE — Telephone Encounter (Signed)
 Probably will have difficulty getting Xolair approval. She has script for [facility] and I had to reach out to her to call pharmacy not sure if she has started yet havent followed up this last week with that pharmacy

## 2023-07-06 NOTE — Telephone Encounter (Signed)
 Per pharmacy was shipped out yesterday

## 2023-07-06 NOTE — Telephone Encounter (Signed)
 Ok if Neluvio is already approved and ready, let's just go ahead and do that.   Malachi Bonds, MD [facility]

## 2023-07-07 NOTE — Telephone Encounter (Signed)
Sounds good! We will see how it goes.  Cindy BondsJoel Matelyn Antonelli, MD [facility] [place]

## 2023-08-17 ENCOUNTER — Ambulatory Visit: Admitting: Allergy & Immunology

## 2023-08-17 ENCOUNTER — Encounter: Payer: Self-pay | Admitting: Allergy & Immunology

## 2023-08-17 ENCOUNTER — Other Ambulatory Visit: Payer: Self-pay

## 2023-08-17 VITALS — BP 120/68 | HR 70 | Temp 98.1°F | Resp 18

## 2023-08-17 DIAGNOSIS — J4530 Mild persistent asthma, uncomplicated: Secondary | ICD-10-CM | POA: Diagnosis not present

## 2023-08-17 DIAGNOSIS — R768 Other specified abnormal immunological findings in serum: Secondary | ICD-10-CM | POA: Diagnosis not present

## 2023-08-17 DIAGNOSIS — T7801XD Anaphylactic reaction due to peanuts, subsequent encounter: Secondary | ICD-10-CM | POA: Diagnosis not present

## 2023-08-17 DIAGNOSIS — B999 Unspecified infectious disease: Secondary | ICD-10-CM

## 2023-08-17 DIAGNOSIS — J3089 Other allergic rhinitis: Secondary | ICD-10-CM

## 2023-08-17 DIAGNOSIS — T7805XD Anaphylactic reaction due to tree nuts and seeds, subsequent encounter: Secondary | ICD-10-CM

## 2023-08-17 DIAGNOSIS — L253 Unspecified contact dermatitis due to other chemical products: Secondary | ICD-10-CM

## 2023-08-17 DIAGNOSIS — L309 Dermatitis, unspecified: Secondary | ICD-10-CM

## 2023-08-17 DIAGNOSIS — J302 Other seasonal allergic rhinitis: Secondary | ICD-10-CM

## 2023-08-17 NOTE — Patient Instructions (Addendum)
 1. Severe eczema - We are going to change from the [facility].  - Dr. Daved Eriksson wanted to start Xolair (which is an injectable medication that neutralizes IgE). - So with your elevated IgE level, we feel that this might help your situation.  - Information provided on Xolair.  - We can do a "detox" time.  - CPT code for the testing is: 81432  2. Contact dermatitis due to chemicals - Continue to avoid all of your triggering chemicals.  3. Seasonal and perennial allergic rhinitis (grasses, weeds, ragweed, trees, molds, dust mites, cats, dogs, roach) - Take cetirizine 10mg  daily.  - Get it on [employer]!     4. Recurrent infections - You were only protective to 2/23 serotypes of Streptococcus pneumonia. - The next step is to get the Pneumovax and then get repeat titers in 4-6 weeks.  - We can hold off on that for now.   5. 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  - EpiPen  is up to date.   6. Mild persistent asthma, uncomplicated - Lung testing looks excellent today.  - Continue with AirSupra   7. Return in about 6 months (around 02/16/2024). You can have the follow up appointment with Dr. Idolina Maker or a Nurse Practicioner (our Nurse Practitioners are excellent and always have Physician oversight!).    Please inform us  of any Emergency Department visits, hospitalizations, or changes in symptoms. Call us  before going to the ED for breathing or allergy symptoms since we might be able to fit you in for a sick visit. Feel free to contact us  anytime with any questions, problems, or concerns.  It was a pleasure to meet you today!  Websites that have reliable patient information: 1. American Academy of Asthma, Allergy, and Immunology: www.aaaai.org 2. Food Allergy Research and Education (FARE): foodallergy.org 3. Mothers of Asthmatics: http://www.asthmacommunitynetwork.org 4. American College of Allergy, Asthma, and  Immunology: www.acaai.org      "Like" us  on Facebook and Instagram for our latest updates!      A healthy democracy works best when [employer] participate! Make sure you are registered to vote! If you have moved or changed any of your contact information, you will need to get this updated before voting! Scan the QR codes below to learn more!       Our [facility] phlebotomist is here during the following hours:  - Monday: 9am to 12:30pm and 1:30pm to 5pm - Tuesday: 9am to 12:30pm and 1:30pm to 5:30pm - Wednesday: 9am to 12:30pm and 1:30pm to 5pm - Thursday: 9am to 12:30pm and 1:30pm to 5:30pm - Friday: 9am - 12:30pm and 1:30pm to 4:00pm

## 2023-08-17 NOTE — Progress Notes (Unsigned)
 FOLLOW UP  Date of Service/Encounter:  08/17/23   Assessment:   Severe eczema - transitioning to Xolair   Contact dermatitis due to chemicals (gold sodium thiosulfate, cocamidopropyl betaine, decyl glucoside, and pramoxine hydrochloride)   Seasonal and perennial allergic rhinitis   Recurrent infections - with inadequate protection against Streptococcus pneumonia   Anaphylaxis due to tree nut   Peanut -induced anaphylaxis   Mild persistent asthma, uncomplicated  Plan/Recommendations:   1. Severe eczema - We are going to change from the [facility].  - Dr. Daved Eriksson wanted to start Xolair (which is an injectable medication that neutralizes IgE). - So with your elevated IgE level, we feel that this might help your situation.  - Information provided on Xolair.  - Genetic testing performed today ([employer] Inborn Errors of Immunity and Cyotopenias Panel).  - We can do a "detox" time if this is what you prefer.  - We will coordinate this with Dr. Daved Eriksson.   2. Contact dermatitis due to chemicals - Continue to avoid all of your triggering chemicals.  3. Seasonal and perennial allergic rhinitis (grasses, weeds, ragweed, trees, molds, dust mites, cats, dogs, roach) - Take cetirizine 10mg  daily.  - Get it on [employer].   4. Recurrent infections - You were only protective to 2/23 serotypes of Streptococcus pneumonia. - The next step is to get the Pneumovax and then get repeat titers in 4-6 weeks.  - We can hold off on that for now.   5. 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  - EpiPen  is up to date.   6. Mild persistent asthma, uncomplicated - Lung testing looks excellent today.  - Continue with AirSupra   7. Return in about 6 months (around 02/16/2024). You can have the follow up appointment with Dr. Idolina Maker or a Nurse Practicioner (our Nurse Practitioners are excellent and always have Physician oversight!).     Subjective:   Cindy Huynh is a 48 y.o. female presenting today for follow up of  Chief Complaint  Patient presents with   Follow-up    Eczema- not good. Since taking Nemluvio, gotten worse. Talked with derm, and was given a round steriods which cleared things us  but soon as it was finished, flared back up. Lots of dry skin and oozing from scalp and behind the ears.  Asthma- a little bit worse. Uses rescue inhaler a few times a day Allergies- doing fine.    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  Discussed the use of AI scribe software for clinical note transcription with the patient and/or guardian, who gave verbal consent to proceed.  Cindy Huynh is a 48 y.o. female presenting for a follow up visit.  She was last seen in January 2025.  At that time, we ended up changing to [facility].  For her recurrent infections, we got an immune screen.  For her allergic rhinitis, we obtained environmental allergy testing  that was positive to the entire panel.  She continue to avoid all of her triggering chemicals.  She also continue to avoid peanuts, tree nuts, avocado, and strawberry.  Lung testing looked good.  We started her on AirSupra 2 puffs every four hours daily as needed.  Her labs showed that she was protective to only 2 out of 23 strains of Streptococcus pneumonia.  She was protective against tetanus and diphtheria.  Complement  activity was normal.  Complete blood count was normal as were the immunoglobulins.  We recommended that she get the Pneumovax.  Environmental allergy panel was positive to everything.  Cat and dog were the most prominent.  Nut panel was very elevated.  We recommended continued avoidance of peanuts and tree nuts.  Avocado and strawberry were also very elevated. She has elevated IgE levels greater than 50,000, significantly above the normal range of 6 to 500. She has a history of environmental allergies, including peanuts, avocados, and nuts.  Since the last visit, she has been a bit worse.   Asthma/Respiratory Symptom History: She has a history of asthma and uses albuterol  as needed. She occasionally uses Symbicort , but this is not a routine medication for her. She apparently was on theophylline when she was a child. She reports that they threw "everything at [her]". Eventually they just otok everything off and then added things back one by one to see what worked and what did not work. She has had better controlled asthma over time.   Allergic Rhinitis Symptom History: For her allergies, she takes cetirizine as needed.  She has a whole host of sensitizations, but she denies any severe symptoms for the most part.   Skin Symptom History: She experiences recurrent skin infections, particularly staph infections, and has a history of pneumonia, though not recently. Her skin is prone to infections if not properly cared for. Eczema covers 98% of her body and is exacerbated by stress. She has tried various treatments for her condition. Nemluvio worsened her symptoms, causing red spots on her legs, feet, and face, and exacerbating her eczema. Dupixent was initially effective but caused issues after a year of use. A 14-day taper of prednisone  and Keflex  temporarily cleared her symptoms, but they returned post-treatment. She experienced severe anxiety, irritability, and sleep disturbances while on prednisone . She would  prefer stay off of this. She has also failed Rinvoq.   Infection Symptom History: She avoids flu shots due to past experiences of getting the flu post-vaccination but keeps her tetanus shots up to date due to frequent injuries. She is not remotely interested in getting the pneumonia vaccine with repeat testing. She is not interested in more vaccines at all and working up this immunodeficiency further than some genetic testing. She is open to doing the genetic testing. She was wondering about the CPT codes at first, but at the end of the visit, she was just fine with doing the testing without checking first. Her infection history, once again, is fairly innocuous. She reports that she is not sure the last time that she took some prolonged period of time off of work for illness. She is typically the health one and she does not get sick routinely.  Her infections are all isolated to her skin for the most part. She denies any retained or primary teeth. She has never had a severe pneumonia and denies any fungal infections.  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 She has been working overtime, particularly during the COVID-19 pandemic, which has added to her stress levels.   Otherwise, there have been no changes to her past medical history, surgical history, family history, or social history.    Review of systems otherwise negative other than that mentioned in the HPI.    Objective:   Blood pressure 120/68, pulse 70, temperature 98.1 F (36.7 C), temperature source Temporal, resp. rate 18, SpO2 98%. There is no height or weight on file to calculate BMI.    Physical Exam Vitals reviewed.  Constitutional:      Appearance: She is well-developed.     Comments: Talkative and good sense of humor.  HENT:     Head: Normocephalic and atraumatic.     Right Ear: Tympanic membrane, ear canal  and external ear normal. No drainage, swelling or tenderness. Tympanic membrane is not injected, scarred, erythematous, retracted or bulging.     Left Ear: Tympanic membrane, ear canal and external ear normal. No drainage, swelling or tenderness. Tympanic membrane is not injected, scarred, erythematous, retracted or bulging.     Nose: Mucosal edema and rhinorrhea present. No nasal deformity or septal deviation.     Right Turbinates: Enlarged, swollen and pale.     Left Turbinates: Enlarged, swollen and pale.     Right Sinus: No maxillary sinus tenderness or frontal sinus tenderness.     Left Sinus: No maxillary sinus tenderness or frontal sinus tenderness.     Mouth/Throat:     Mouth: Mucous membranes are not pale and not dry.     Pharynx: Uvula midline.  Eyes:     General:        Right eye: No discharge.        Left eye: No discharge.     Conjunctiva/sclera: Conjunctivae normal.     Right eye: Right conjunctiva is not injected. No chemosis.    Left eye: Left conjunctiva is not injected. No chemosis.    Pupils: Pupils are equal, round, and reactive to light.  Cardiovascular:     Rate and Rhythm: Normal rate and regular rhythm.     Heart sounds: Normal heart sounds.  Pulmonary:     Effort: Pulmonary effort is normal. No tachypnea, accessory muscle usage or respiratory distress.     Breath sounds: Normal breath sounds. No wheezing, rhonchi or rales.  Chest:     Chest wall: No tenderness.  Abdominal:     Tenderness: There is no abdominal tenderness. There is no guarding or rebound.  Lymphadenopathy:     Head:     Right side of head: No submandibular, tonsillar or occipital adenopathy.     Left side of head: No submandibular, tonsillar or occipital adenopathy.     Cervical: No cervical adenopathy.  Skin:    General: Skin is warm.     Capillary Refill: Capillary refill takes less than 2 seconds.     Coloration: Skin is not pale.     Findings: Rash present. No abrasion, erythema or  petechiae. Rash is not papular, urticarial or vesicular.     Comments: She does have some thickened skin on her bilateral arms. She also has some eczematous lesions on her arms, neck, and cheeks. No honey-crusting or oozing noted. She has overall erythema present.   Neurological:     Mental Status: She is alert.  Psychiatric:        Behavior: Behavior is cooperative.  Diagnostic studies:    Spirometry: results normal (FEV1: 2.32/86%, FVC: 3.61/107%, FEV1/FVC: 64%).    Spirometry consistent with normal pattern.    Allergy Studies: none       Drexel Gentles, MD  [facility] 

## 2023-08-19 ENCOUNTER — Ambulatory Visit: Payer: BC Managed Care – PPO | Admitting: Allergy & Immunology

## 2023-08-19 ENCOUNTER — Telehealth: Payer: Self-pay | Admitting: *Deleted

## 2023-08-19 MED ORDER — XOLAIR 300 MG/2ML ~~LOC~~ SOSY: 600.0000 mg | PREFILLED_SYRINGE | SUBCUTANEOUS | 11 refills | Status: DC

## 2023-08-19 NOTE — Telephone Encounter (Signed)
 T/c call to patient advised approval for XOlair  for food allergy 600mg  every 14 days rx and copay card to [facility] and will reach out once delivery set to make appt to start therapy

## 2023-08-19 NOTE — Telephone Encounter (Signed)
-----   Message from [place] Chuck sent at 08/18/2023  9:32 PM EDT ----- Changing to Xolair . Nemluvio caused some kind of weird rash on her legs. Dr. Daved Eriksson (Derm) wants to try Xolair .

## 2023-08-23 ENCOUNTER — Other Ambulatory Visit (HOSPITAL_COMMUNITY): Payer: Self-pay

## 2023-08-23 MED ORDER — XOLAIR 300 MG/2 ML SUBCUTANEOUS SYRINGE
SUBCUTANEOUS | 11 refills | 28.00000 days
Start: 2023-08-23 — End: ?

## 2023-08-23 NOTE — Addendum Note (Signed)
 Addended by: Evangelina Hilt on: 08/23/2023 11:21 AM   Modules accepted: Orders

## 2023-08-23 NOTE — Telephone Encounter (Signed)
 Called patient and advised welsey unable to fil her xolair  due to exclusion will send to [facility]

## 2023-08-23 NOTE — Telephone Encounter (Signed)
Thanks, Tammy!   Akeel Reffner, MD [facility]  

## 2023-08-30 DIAGNOSIS — Z91010 Allergy to peanuts: Principal | ICD-10-CM

## 2023-09-08 NOTE — Unmapped (Signed)
 [facility] Specialty Medication Onboarding    Specialty Medication: Xolair  Prior Authorization: Approved   Financial Assistance: Yes - copay card approved as secondary   Final Copay/Day Supply: $0 / 28    Insurance Restrictions: None     Notes to Pharmacist: None  Credit Card on File: not applicable    The triage team has completed the benefits investigation and has determined that the patient is able to fill this medication at [facility]. Please contact the patient to complete the onboarding or follow up with the prescribing physician as needed.

## 2023-09-10 NOTE — Unmapped (Signed)
 This patient is receiving Xolair  as a clinic administered medication. Pharmacist reviewed the prescription and the patient's chart and determined that therapy is appropriate.  Patient is: not contacted as copay is $0. All future communication to occur between the [facility] Special[facility] and Home Delivery Pharmacy and the clinic. This patient is being disenrolled from our specialty management program and will be added to a patient list for appropriate follow up.    [facility] Specialty and Home Delivery Pharmacy Clinic Administered Medication Refill Coordination Note      NAME:Annette Carpenter DOB: February 12, 1976      Medication: Xolair     Day Supply: 28 days      SHIPPING    Next delivery from [facility] Special[facility] and [facility] Delivery Pharm[facility] [phone]) to Asthma & Allergy Clinic at [facility] for [name] is scheduled for 09/15/23.    Clinic contact: Tammy - [phone]    Patient's next nurse visit for administration: TBD - First 3 doses must be administered in clinic.    We will follow up with clinic monthly for standard refill processing and delivery.      Joseph Nickel, PharmD  [facility] Specialty and Home Delivery Pharmacy Specialty Pharmacist

## 2023-09-14 MED FILL — XOLAIR 300 MG/2 ML SUBCUTANEOUS SYRINGE: SUBCUTANEOUS | 28 days supply | Qty: 8 | Fill #0

## 2023-09-24 DIAGNOSIS — I10 Essential (primary) hypertension: Secondary | ICD-10-CM | POA: Diagnosis not present

## 2023-09-24 DIAGNOSIS — Z79899 Other long term (current) drug therapy: Secondary | ICD-10-CM | POA: Diagnosis not present

## 2023-09-24 DIAGNOSIS — Z0000 Encounter for general adult medical examination without abnormal findings: Secondary | ICD-10-CM | POA: Diagnosis not present

## 2023-09-24 DIAGNOSIS — J4540 Moderate persistent asthma, uncomplicated: Secondary | ICD-10-CM | POA: Diagnosis not present

## 2023-09-24 DIAGNOSIS — L309 Dermatitis, unspecified: Secondary | ICD-10-CM | POA: Diagnosis not present

## 2023-09-30 ENCOUNTER — Ambulatory Visit

## 2023-09-30 DIAGNOSIS — Z91010 Allergy to peanuts: Secondary | ICD-10-CM

## 2023-09-30 MED ORDER — OMALIZUMAB 300 MG/2  ML ~~LOC~~ SOSY
600.0000 mg | PREFILLED_SYRINGE | SUBCUTANEOUS | Status: AC
  Administered 2023-09-30 – 2024-05-05 (×15): 600 mg via SUBCUTANEOUS

## 2023-09-30 NOTE — Progress Notes (Signed)
 Immunotherapy   Patient Details  Name: Cindy Huynh MRN: [id number] Date of Birth: Aug 09, 1975  09/30/2023  Theresea B Viloria started injections for peanut  allergies. Patient received a 600 mg dose of Xolair . Patient waited in office for 1 hour with no problems.  Frequency:every 14 days Epi-Pen:Epi-Pen Available  Consent signed and patient instructions given.   Denton Flakes 09/30/2023, 5:01 PM

## 2023-10-07 ENCOUNTER — Encounter: Payer: Self-pay | Admitting: Allergy & Immunology

## 2023-10-07 DIAGNOSIS — L309 Dermatitis, unspecified: Secondary | ICD-10-CM

## 2023-10-07 DIAGNOSIS — Z5181 Encounter for therapeutic drug level monitoring: Secondary | ICD-10-CM

## 2023-10-07 DIAGNOSIS — D721 Eosinophilia, unspecified: Secondary | ICD-10-CM

## 2023-10-07 DIAGNOSIS — B999 Unspecified infectious disease: Secondary | ICD-10-CM

## 2023-10-07 DIAGNOSIS — R768 Other specified abnormal immunological findings in serum: Secondary | ICD-10-CM

## 2023-10-07 NOTE — Unmapped (Signed)
 [facility] Specialty and Home Delivery Pharmacy Clinic Administered Medication Refill Coordination Note      NAME:[name] DOB: 06/07/1975      Medication: Xolair     Day Supply: 28 days      SHIPPING    Next delivery from [facility] Special[facility] and [facility] Delivery Pharmacy [phone]) to Asthma & Allergy Clinic at [facility] for Annette Carpenter is scheduled for 06/24    Clinic contact: Tammy -    Patient's next nurse visit for administration: 06/27    We will follow up with clinic monthly for standard refill processing and delivery.      Rashidi Loh Senora Dame  [facility] Specialty and [facility]

## 2023-10-11 MED FILL — XOLAIR 300 MG/2 ML SUBCUTANEOUS SYRINGE: SUBCUTANEOUS | 28 days supply | Qty: 8 | Fill #1

## 2023-10-14 DIAGNOSIS — L2089 Other atopic dermatitis: Secondary | ICD-10-CM | POA: Diagnosis not present

## 2023-10-15 ENCOUNTER — Ambulatory Visit

## 2023-10-15 DIAGNOSIS — Z5181 Encounter for therapeutic drug level monitoring: Secondary | ICD-10-CM | POA: Diagnosis not present

## 2023-10-15 DIAGNOSIS — Z91010 Allergy to peanuts: Secondary | ICD-10-CM

## 2023-10-15 DIAGNOSIS — R768 Other specified abnormal immunological findings in serum: Secondary | ICD-10-CM | POA: Diagnosis not present

## 2023-10-15 DIAGNOSIS — L309 Dermatitis, unspecified: Secondary | ICD-10-CM | POA: Diagnosis not present

## 2023-10-15 DIAGNOSIS — B999 Unspecified infectious disease: Secondary | ICD-10-CM | POA: Diagnosis not present

## 2023-10-15 DIAGNOSIS — D721 Eosinophilia, unspecified: Secondary | ICD-10-CM | POA: Diagnosis not present

## 2023-10-15 NOTE — Progress Notes (Addendum)
 Immunotherapy   Patient Details  Name: EPSIE WALTHALL MRN: [id number] Date of Birth: 1975/10/09  10/15/2023  Shamari B Tomassi here to self administer. Patients spouse was show how to administer her Xolair  injections and demonstrated correct administration. Patient received 600 mg of Xolair  for food allergies. Frequency: every 14 days Epi-Pen:Epi-Pen Available  Consent signed and patient instructions given.   Rosina LOISE Irving 10/15/2023, 3:01 PM

## 2023-10-15 NOTE — Telephone Encounter (Signed)
 Labs ordered - we will plan to get these later today.   I am ordering a magnesium and uric acid as well, per Dr. Mena request. She recently started cyclosporine and she wanted to check those levels. She had a recent normal CMP and CBC which showed eosinophilia (AEC 1100).   Marty Shaggy, MD [facility] [place] 

## 2023-10-16 LAB — URIC ACID: Uric Acid: 6.5 mg/dL — ABNORMAL HIGH (ref 2.6–6.2)

## 2023-10-16 LAB — MAGNESIUM: Magnesium: 2 mg/dL (ref 1.6–2.3)

## 2023-10-19 LAB — ANCA TITERS
Atypical pANCA: <1:20 {titer}
C-ANCA: <1:20 {titer}
P-ANCA: <1:20 {titer}

## 2023-10-19 LAB — T-HELPER CELLS CD4/CD8 %
% CD 4 Pos. Lymph.: 82.4 % — ABNORMAL HIGH (ref 30.8–58.5)
Absolute CD 4 Helper: 1154 /uL (ref 359–1519)
CD3+CD4+ Cells/CD3+CD8+ Cells Bld: 6.39 — ABNORMAL HIGH (ref 0.92–3.72)
CD3+CD8+ Cells # Bld: 181 /uL (ref 109–897)
CD3+CD8+ Cells NFr Bld: 12.9 % (ref 12.0–35.5)

## 2023-10-19 LAB — LYMPH ENUMERATION, BASIC & NK CELLS
% CD 3 Pos. Lymph.: 87.9 % — ABNORMAL HIGH (ref 57.5–86.2)
% CD 4 Pos. Lymph.: 74.7 % — ABNORMAL HIGH (ref 30.8–58.5)
% NK (CD56/16): 4.3 % (ref 1.4–19.4)
Ab NK (CD56/16): 60 /uL (ref 24–406)
Absolute CD 3: 1231 /uL (ref 622–2402)
Absolute CD 4 Helper: 1046 /uL (ref 359–1519)
Basophils Absolute: 0.1 10*3/uL (ref 0.0–0.2)
Basos: 1 %
CD19 % B Cell: 7.2 % (ref 3.3–25.4)
CD19 Abs: 101 /uL (ref 12–645)
CD4/CD8 Ratio: 5.66 — ABNORMAL HIGH (ref 0.92–3.72)
CD8 % Suppressor T Cell: 13.2 % (ref 12.0–35.5)
CD8 T Cell Abs: 185 /uL (ref 109–897)
EOS (ABSOLUTE): 0.5 10*3/uL — ABNORMAL HIGH (ref 0.0–0.4)
Eos: 6 %
Hematocrit: 48.1 % — ABNORMAL HIGH (ref 34.0–46.6)
Hemoglobin: 16.3 g/dL — ABNORMAL HIGH (ref 11.1–15.9)
Immature Grans (Abs): 0 10*3/uL (ref 0.0–0.1)
Immature Granulocytes: 0 %
Lymphocytes Absolute: 1.4 10*3/uL (ref 0.7–3.1)
Lymphs: 19 %
MCH: 35.1 pg — ABNORMAL HIGH (ref 26.6–33.0)
MCHC: 33.9 g/dL (ref 31.5–35.7)
MCV: 104 fL — ABNORMAL HIGH (ref 79–97)
Monocytes Absolute: 0.6 10*3/uL (ref 0.1–0.9)
Monocytes: 8 %
Neutrophils Absolute: 5.1 10*3/uL (ref 1.4–7.0)
Neutrophils: 65 %
Platelets: 302 10*3/uL (ref 150–450)
RBC: 4.64 x10E6/uL (ref 3.77–5.28)
RDW: 13.3 % (ref 11.7–15.4)
WBC: 7.7 10*3/uL (ref 3.4–10.8)

## 2023-10-19 LAB — TRYPTASE: Tryptase: 2 ug/L — AB (ref 2.2–13.2)

## 2023-10-19 LAB — STRONGYLOIDES, AB, IGG

## 2023-10-21 ENCOUNTER — Ambulatory Visit: Payer: Self-pay | Admitting: Allergy & Immunology

## 2023-10-21 DIAGNOSIS — D721 Eosinophilia, unspecified: Secondary | ICD-10-CM

## 2023-10-21 DIAGNOSIS — R768 Other specified abnormal immunological findings in serum: Secondary | ICD-10-CM

## 2023-10-21 DIAGNOSIS — B999 Unspecified infectious disease: Secondary | ICD-10-CM

## 2023-10-28 ENCOUNTER — Ambulatory Visit

## 2023-10-28 DIAGNOSIS — Z91010 Allergy to peanuts: Secondary | ICD-10-CM

## 2023-10-28 NOTE — Unmapped (Signed)
 [facility] Specialty and Home Delivery Pharmacy Clinic Administered Medication Refill Coordination Note      NAME:[name] DOB: May 19, 1975      Medication: Xolair     Day Supply: 28 days      SHIPPING      Next delivery from [facility] Special[facility] and [facility] Delivery Pharmacy [phone]) to [facility] for Annette Carpenter is scheduled for 11/02/23.    Clinic contact: [address], [place] [address]    Patient's next nurse visit for administration: Tammy V..    We will follow up with clinic monthly for standard refill processing and delivery.      Kyra Myron  [facility] Specialty and Home Delivery Pharmacy Specialty Technician

## 2023-10-29 DIAGNOSIS — Z79899 Other long term (current) drug therapy: Secondary | ICD-10-CM | POA: Diagnosis not present

## 2023-10-29 DIAGNOSIS — L2089 Other atopic dermatitis: Secondary | ICD-10-CM | POA: Diagnosis not present

## 2023-11-02 DIAGNOSIS — L309 Dermatitis, unspecified: Secondary | ICD-10-CM | POA: Diagnosis not present

## 2023-11-02 DIAGNOSIS — B999 Unspecified infectious disease: Secondary | ICD-10-CM | POA: Diagnosis not present

## 2023-11-02 DIAGNOSIS — D721 Eosinophilia, unspecified: Secondary | ICD-10-CM | POA: Diagnosis not present

## 2023-11-02 MED FILL — XOLAIR 300 MG/2 ML SUBCUTANEOUS SYRINGE: SUBCUTANEOUS | 28 days supply | Qty: 8 | Fill #2

## 2023-11-04 LAB — OVA AND PARASITE EXAMINATION

## 2023-11-12 ENCOUNTER — Ambulatory Visit

## 2023-11-12 DIAGNOSIS — Z91010 Allergy to peanuts: Secondary | ICD-10-CM | POA: Diagnosis not present

## 2023-11-16 NOTE — Telephone Encounter (Signed)
 Reviewed notes from multidisciplinary meeting with Dr. Kathrin Blanch and Dr. Jolene Jewell.  I ordered the Toxocara antibodies as well as the ova and parasite test (x 3).  I also ordered the SPEP and UPEP.  I did talk to our [facility] phlebotomist and we found an ARUP 13-cytokine panel that might work.   We are also going to get clearance for us  to share information with Dr. Lorane Meissner at [facility].  Marty Shaggy, MD [facility] [place] 

## 2023-11-26 ENCOUNTER — Ambulatory Visit

## 2023-11-26 DIAGNOSIS — Z91010 Allergy to peanuts: Secondary | ICD-10-CM

## 2023-12-01 NOTE — Unmapped (Signed)
 [facility] Specialty and Home Delivery Pharmacy Clinic Administered Medication Refill Coordination Note      NAME:Annette Carpenter DOB: 05/03/1975      Medication: Xolair     Day Supply: 28 days      SHIPPING      Next delivery from [facility] Special[facility] and Home Delivery Pharmacy [phone]) to [facility] Center for Arly Urquilla is scheduled for 08/19.    Clinic contact: Tammy     Patient's next nurse visit for administration: 08/21.    We will follow up with clinic monthly for standard refill processing and delivery.      Deavin Forst LITTIE Hope  [facility] Specialty and [facility]

## 2023-12-03 DIAGNOSIS — Z79899 Other long term (current) drug therapy: Secondary | ICD-10-CM | POA: Diagnosis not present

## 2023-12-03 DIAGNOSIS — L2089 Other atopic dermatitis: Secondary | ICD-10-CM | POA: Diagnosis not present

## 2023-12-06 MED FILL — XOLAIR 300 MG/2 ML SUBCUTANEOUS SYRINGE: SUBCUTANEOUS | 28 days supply | Qty: 8 | Fill #3

## 2023-12-09 ENCOUNTER — Ambulatory Visit

## 2023-12-10 ENCOUNTER — Ambulatory Visit

## 2023-12-10 DIAGNOSIS — Z91010 Allergy to peanuts: Secondary | ICD-10-CM

## 2023-12-22 NOTE — Unmapped (Signed)
 [facility] Specialty and Home Delivery Pharmacy Clinic Administered Medication Refill Coordination Note      NAME:Annette Carpenter DOB: Dec 18, 1975      Medication: Xolair     Day Supply: 28 days      SHIPPING      Next delivery from [facility] Specialty and Home Delivery Pharmacy [phone]) to [facility] Center for Annette Carpenter is scheduled for 09/10    Clinic contact: Tammy     Patient's next nurse visit for administration: 09/08-clinic has dose     We will follow up with clinic monthly for standard refill processing and delivery.      Baudelio Karnes LITTIE Hope  [facility] Specialty and [facility]

## 2023-12-23 NOTE — Telephone Encounter (Signed)
 LVM for patient to return call to office.

## 2023-12-27 ENCOUNTER — Encounter: Payer: Self-pay | Admitting: Allergy & Immunology

## 2023-12-27 ENCOUNTER — Ambulatory Visit (INDEPENDENT_AMBULATORY_CARE_PROVIDER_SITE_OTHER)

## 2023-12-27 DIAGNOSIS — R768 Other specified abnormal immunological findings in serum: Secondary | ICD-10-CM | POA: Diagnosis not present

## 2023-12-27 DIAGNOSIS — Z91010 Allergy to peanuts: Secondary | ICD-10-CM

## 2023-12-27 DIAGNOSIS — D721 Eosinophilia, unspecified: Secondary | ICD-10-CM | POA: Diagnosis not present

## 2023-12-27 DIAGNOSIS — B999 Unspecified infectious disease: Secondary | ICD-10-CM | POA: Diagnosis not present

## 2023-12-27 NOTE — Telephone Encounter (Signed)
 Patient was here for injection, so we discussed her next steps per Dr. Iva. Pt verbally understood and would get the needed blood work, urine sample, and stool samples needed. Per patient she has already signed the medical release form. She stated that [facility] handled that part.

## 2023-12-28 MED FILL — XOLAIR 300 MG/2 ML SUBCUTANEOUS SYRINGE: SUBCUTANEOUS | 28 days supply | Qty: 8 | Fill #4

## 2023-12-31 LAB — CYTOKINE PANEL
Interferon gamma: <4.2 pg/mL (ref <=4.2)
Interleukin 1 beta: <6.5 pg/mL (ref <=6.7)
Interleukin 10: 4.3 pg/mL — ABNORMAL HIGH (ref <=2.8)
Interleukin 12: <1.9 pg/mL (ref <=1.9)
Interleukin 13: <1.7 pg/mL (ref <=2.3)
Interleukin 17: <1.4 pg/mL (ref <=1.4)
Interleukin 2 Rec (CD25), Solu: 857.1 pg/mL (ref 175.3–858.2)
Interleukin 2: <2.1 pg/mL (ref <=2.1)
Interleukin 4: <2.2 pg/mL (ref <=2.2)
Interleukin 5: 4 pg/mL — ABNORMAL HIGH (ref <=2.1)
Interleukin 6: <2 pg/mL (ref <=2.0)
Interleukin 8: <3 pg/mL (ref <=3.0)
Tumor Necrosis Factor - alpha: <1.7 pg/mL (ref <=7.2)

## 2024-01-01 LAB — PROTEIN ELECTROPHORESIS, SERUM, WITH REFLEX
A/G Ratio: 1.1 (ref 0.7–1.7)
Albumin ELP: 3.9 g/dL (ref 2.9–4.4)
Alpha 1: 0.3 g/dL (ref 0.0–0.4)
Alpha 2: 0.6 g/dL (ref 0.4–1.0)
Beta: 1.2 g/dL (ref 0.7–1.3)
Gamma Globulin: 1.3 g/dL (ref 0.4–1.8)
Globulin, Total: 3.4 g/dL (ref 2.2–3.9)
Total Protein: 7.3 g/dL (ref 6.0–8.5)

## 2024-01-01 LAB — TOXOCARA AB, IGG, S

## 2024-01-11 ENCOUNTER — Telehealth: Payer: Self-pay | Admitting: Allergy & Immunology

## 2024-01-11 DIAGNOSIS — D721 Eosinophilia, unspecified: Secondary | ICD-10-CM

## 2024-01-11 DIAGNOSIS — R768 Other specified abnormal immunological findings in serum: Secondary | ICD-10-CM | POA: Diagnosis not present

## 2024-01-11 DIAGNOSIS — B999 Unspecified infectious disease: Secondary | ICD-10-CM | POA: Diagnosis not present

## 2024-01-11 NOTE — Telephone Encounter (Signed)
 Additional O&P signed.   Marty Shaggy, MD [facility] 

## 2024-01-13 DIAGNOSIS — D721 Eosinophilia, unspecified: Secondary | ICD-10-CM | POA: Diagnosis not present

## 2024-01-13 DIAGNOSIS — B999 Unspecified infectious disease: Secondary | ICD-10-CM | POA: Diagnosis not present

## 2024-01-14 ENCOUNTER — Ambulatory Visit

## 2024-01-14 DIAGNOSIS — Z91010 Allergy to peanuts: Secondary | ICD-10-CM | POA: Diagnosis not present

## 2024-01-14 DIAGNOSIS — L2089 Other atopic dermatitis: Secondary | ICD-10-CM | POA: Diagnosis not present

## 2024-01-14 DIAGNOSIS — Z79899 Other long term (current) drug therapy: Secondary | ICD-10-CM | POA: Diagnosis not present

## 2024-01-14 LAB — UPEP/TP, 24-HR URINE

## 2024-01-17 LAB — OVA AND PARASITE EXAMINATION

## 2024-01-18 LAB — UPEP/TP, 24-HR URINE
Albumin, U: 32.3 %
Alpha 1, Urine: 7.6 %
Alpha 2, Urine: 14 %
Beta, Urine: 26.6 %
Gamma Globulin, Urine: 19.5 %
Protein, 24H Urine: 130 mg/(24.h) (ref 30–150)
Protein, Ur: 10.4 mg/dL

## 2024-01-19 MED ORDER — BREZTRI AEROSPHERE 160-9-4.8 MCG/ACT IN AERO: 2.0000 | INHALATION_SPRAY | Freq: Two times a day (BID) | RESPIRATORY_TRACT | 5 refills | Status: AC

## 2024-01-19 NOTE — Addendum Note (Signed)
 Addended by: IVA MARTY SALTNESS on: 01/19/2024 04:17 PM   Modules accepted: Orders

## 2024-01-20 NOTE — Unmapped (Signed)
 Tammy (Clinic POC) has been contacted in regards to a refill of Xolair  for Lucky Tones. At this time, the clinic has declined refill due to clinic having 2 doses on hand. Refill assessment call date has been updated per the clinic's request.    Note: Next dose will be administered on 10/10. Clinic does not need a refill until the 10/24 dose.

## 2024-01-28 ENCOUNTER — Ambulatory Visit

## 2024-01-28 DIAGNOSIS — Z91010 Allergy to peanuts: Secondary | ICD-10-CM

## 2024-02-04 LAB — OVA AND PARASITE EXAMINATION

## 2024-02-10 ENCOUNTER — Ambulatory Visit: Payer: Self-pay | Admitting: Allergy & Immunology

## 2024-02-14 ENCOUNTER — Ambulatory Visit (INDEPENDENT_AMBULATORY_CARE_PROVIDER_SITE_OTHER)

## 2024-02-14 DIAGNOSIS — Z91010 Allergy to peanuts: Secondary | ICD-10-CM | POA: Diagnosis not present

## 2024-02-14 NOTE — Progress Notes (Signed)
 Transitioning enrollment to [facility] External Clinics

## 2024-02-15 LAB — OVA AND PARASITE EXAMINATION

## 2024-02-15 NOTE — Progress Notes (Signed)
 [facility] Clinic Administered Medication Refill Coordination Note      NAME:Keelyn Hansson DOB: [birth year]      Medication: Xolair     Day Supply: 28 days      SHIPPING      Next delivery from [facility] [phone]) to [facility]  for Ryka Steffler is scheduled for 03/01/2024.    Clinic contact: Tammy    Patient's next nurse visit for administration: 03/03/2024.    We will follow up with clinic monthly for standard refill processing and delivery.      Lamarr CHRISTELLA Dross  [facility] Specialty Technician

## 2024-02-18 DIAGNOSIS — L2089 Other atopic dermatitis: Secondary | ICD-10-CM | POA: Diagnosis not present

## 2024-02-18 DIAGNOSIS — Z79899 Other long term (current) drug therapy: Secondary | ICD-10-CM | POA: Diagnosis not present

## 2024-02-29 MED FILL — XOLAIR 300 MG/2 ML SUBCUTANEOUS SYRINGE: SUBCUTANEOUS | 28 days supply | Qty: 8 | Fill #5

## 2024-03-02 ENCOUNTER — Other Ambulatory Visit: Payer: Self-pay

## 2024-03-02 ENCOUNTER — Encounter: Payer: Self-pay | Admitting: Allergy & Immunology

## 2024-03-02 ENCOUNTER — Ambulatory Visit (INDEPENDENT_AMBULATORY_CARE_PROVIDER_SITE_OTHER): Admitting: Allergy & Immunology

## 2024-03-02 VITALS — BP 132/78 | Temp 98.6°F | Wt 134.0 lb

## 2024-03-02 DIAGNOSIS — D721 Eosinophilia, unspecified: Secondary | ICD-10-CM | POA: Diagnosis not present

## 2024-03-02 DIAGNOSIS — J4530 Mild persistent asthma, uncomplicated: Secondary | ICD-10-CM

## 2024-03-02 DIAGNOSIS — J3089 Other allergic rhinitis: Secondary | ICD-10-CM | POA: Diagnosis not present

## 2024-03-02 DIAGNOSIS — J4540 Moderate persistent asthma, uncomplicated: Secondary | ICD-10-CM

## 2024-03-02 DIAGNOSIS — R7689 Other specified abnormal immunological findings in serum: Secondary | ICD-10-CM | POA: Diagnosis not present

## 2024-03-02 DIAGNOSIS — L309 Dermatitis, unspecified: Secondary | ICD-10-CM

## 2024-03-02 DIAGNOSIS — T7801XD Anaphylactic reaction due to peanuts, subsequent encounter: Secondary | ICD-10-CM

## 2024-03-02 DIAGNOSIS — J302 Other seasonal allergic rhinitis: Secondary | ICD-10-CM

## 2024-03-02 NOTE — Progress Notes (Signed)
 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 Severe eczema - Continue with the Xolair . - Cindy Huynh is doing great with the cyclosporine.  - I think we should do the Nucala (which is an anti-IL5 antibody) rather than Fasenra (which binds to the IL-5 receptor on eosinophils).  [name] has a longer safety profile (now going on nearly 10 years) compared to Fasenra. - With you being on both Xolair  AND cyclosporine, I think going with the one with the longest, safest profile would be best.  - But I will talk to Cindy Huynh to see what she thinks as well.   2. Contact dermatitis due to chemicals - Continue to avoid all of your triggering chemicals.  3. Seasonal and perennial allergic rhinitis (grasses, weeds, ragweed, trees, molds, dust mites, cats, dogs, roach) - Continue with cetirizine 10mg  daily.   4. Recurrent infections - You were only protective to 2/23 serotypes of Streptococcus pneumonia. - The next step is to get the Pneumovax and then get repeat titers in 4-6 weeks.  - We can hold off on that for now.   5. 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  - EpiPen  is up  to date.   6. Moderate persistent asthma, uncomplicated - Lung testing looks much lower today. - You are using a LOT of albuterol , so I think we need to work on getting Nucala approved. - Information provided and consent provided.  - Cindy Huynh will call to discuss the approval process.  - Daily controller medication(s): Breztri 160/9/4.35mcg two puffs twice daily - Prior to physical activity: albuterol  2 puffs 10-15 minutes before physical activity. - Rescue medications: albuterol  4 puffs every 4-6 hours as needed - Asthma control goals:  * Full participation in all desired activities (may need albuterol  before activity) * Albuterol  use two time or less a week on average (not counting use with activity) * Cough interfering with sleep two time or less a month * Oral steroids no more than once a year * No hospitalizations  7. Return in about 3 months (around 06/02/2024). You can have the follow up appointment with Cindy Huynh or a Nurse Practicioner (our Nurse Practitioners are excellent and always have Physician oversight!).   Subjective:   Cindy Huynh is a 48 y.o. female presenting today for follow up of  Chief Complaint  Patient presents with   Other    Patient in room #10 and alone. Pt states to follow up with Cindy Huynh and new inhaler.     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  Discussed the use of AI scribe  software for clinical note transcription with the patient and/or guardian, who gave verbal consent to proceed.  Cindy Huynh is a 48 y.o. female presenting for a follow up visit.  She was last seen in April 2025.  At that time, we changed her to Xolair .  We obtained labs to look at immune genetic mutations and also did a cytokine analysis.  For her allergic rhinitis, she remained on cetirizine.  She has a lot of contact dermatitis triggers that she continue to avoid.  She refused the Pneumovax to complete her recurrent infection workup.  Previously she had been protective to only 2 out of 23 serotypes of Streptococcus pneumonia.  She continue to avoid all of her triggering foods.  Asthma was under fair control with AirSupra as needed.  In the meantime, we had a multidisciplinary meeting with Cindy Huynh her dermatologist and Cindy Huynh an immunologist at [facility].  He recommended completing her workup for hypereosinophilia by getting stool samples, all of which were normal.  He also recommended doing an SPEP and UPEP which were normal as well.  He also recommended getting a cytokine panel.  It showed markedly elevated IL-5 and IL 10.  We decided to try to target her IL-5 with Fasenra.  Since the last visit, she has been a bit worse especially with regards to her asthma.   Asthma/Respiratory Symptom History: She remains on the Breztri two puffs twice daily. She uses albuterol  and uses one inhaler once per day. She has been using Breztri for her respiratory condition and uses her rescue inhaler approximately once a month. She smokes about a pack of cigarettes a day, which may contribute to her respiratory symptoms. She has not been on prednisone  for her breathing, but she always does feel better on it. She has not been to the ED or Urgent Care for her breathing problems.   Allergic Rhinitis Symptom History: She has a history of skin issues, which have improved significantly with cyclosporine. She remains on the  cetirizine daily. She is positive to the entire panel, but her IgE is markedly elevated as well.   Skin Symptom History: She is currently on Xolair  but is unsure of its effectiveness as she has not tested it against allergens like peanuts. She is also on cyclosporine, which was initially reduced to 150 mg but resulted in a flare-up of her skin condition. She is now back on 200 mg daily, taking two 100 mg gel caps, which she finds more effective than capsules. She also takes Keflex  500 mg once daily as a preventative measure for skin issues.  Infection Symptom History: She has a history of elevated interleukins, specifically IL-10 and IL-5. She describes her immune system's response to allergens as 'a raging toga camp party' compared to a normal response. She is on Keflex  daily to prevent Staphylococcal skin infections. She has not [facility] from [facility] at this point, but we did confirm that [facility] arrived at [facility]. We did testing additional parasitic infections including three O&P samples which were negative.   Component     Latest Ref Rng 12/27/2023  Interleukin 2     <=  2.1 pg/mL <2.1   Interleukin 2 Rec (CD25), Solu     175.3 - 858.2 pg/mL 857.1   Interleukin 12     <=1.9 pg/mL <1.9   Interferon gamma     <=4.2 pg/mL <4.2   Interleukin 4     <=2.2 pg/mL <2.2   Interleukin 5     <=2.1 pg/mL 4.0 (H)   Interleukin 10     <=2.8 pg/mL 4.3 (H)   Interleukin 13     <=2.3 pg/mL <1.7   Interleukin 1 beta     <=6.7 pg/mL <6.5   Interleukin 6     <=2.0 pg/mL <2.0   Interleukin 8     <=3.0 pg/mL <3.0   Tumor Necrosis Factor - alpha     <=7.2 pg/mL <1.7   Interleukin 17     <=1.4 pg/mL <1.4       Her past medical history includes high blood pressure, for which she takes hydrochlorothiazide . She has regular lab work done to monitor her condition, especially while on cyclosporine, and sees her doctor monthly for blood pressure and lab checks.  Otherwise, there have been no changes to her past  medical history, surgical history, family history, or social history.    Review of systems otherwise negative other than that mentioned in the HPI.    Objective:   Blood pressure 132/78, temperature 98.6 F (37 C), weight 134 lb (60.8 kg). Body mass index is 23.16 kg/m.    Physical Exam Vitals reviewed.  Constitutional:      Appearance: She is well-developed.     Comments: Talkative and good sense of humor.  HENT:     Head: Normocephalic and atraumatic.     Right Ear: Tympanic membrane, ear canal and external ear normal. No drainage, swelling or tenderness. Tympanic membrane is not injected, scarred, erythematous, retracted or bulging.     Left Ear: Tympanic membrane, ear canal and external ear normal. No drainage, swelling or tenderness. Tympanic membrane is not injected, scarred, erythematous, retracted or bulging.     Nose: Mucosal edema and rhinorrhea present. No nasal deformity or septal deviation.     Right Turbinates: Enlarged, swollen and pale.     Left Turbinates: Enlarged, swollen and pale.     Right Sinus: No maxillary sinus tenderness or frontal sinus tenderness.     Left Sinus: No maxillary sinus tenderness or frontal sinus tenderness.     Mouth/Throat:     Mouth: Mucous membranes are not pale and not dry.     Pharynx: Uvula midline.  Eyes:     General:        Right eye: No discharge.        Left eye: No discharge.     Conjunctiva/sclera: Conjunctivae normal.     Right eye: Right conjunctiva is not injected. No chemosis.    Left eye: Left conjunctiva is not injected. No chemosis.    Pupils: Pupils are equal, round, and reactive to light.  Cardiovascular:     Rate and Rhythm: Normal rate and regular rhythm.     Heart sounds: Normal heart sounds.  Pulmonary:     Effort: Pulmonary effort is normal. No tachypnea, accessory muscle usage or respiratory distress.     Breath sounds: Normal breath sounds. No wheezing, rhonchi or rales.     Comments: No wheezing or  crackles noted.  Chest:     Chest wall: No tenderness.  Abdominal:     Tenderness: There is no abdominal tenderness. There is  no guarding or rebound.  Lymphadenopathy:     Head:     Right side of head: No submandibular, tonsillar or occipital adenopathy.     Left side of head: No submandibular, tonsillar or occipital adenopathy.     Cervical: No cervical adenopathy.  Skin:    General: Skin is warm.     Capillary Refill: Capillary refill takes less than 2 seconds.     Coloration: Skin is not pale.     Findings: Rash present. No abrasion, erythema or petechiae. Rash is not papular, urticarial or vesicular.     Comments: She does have some thickened skin on her bilateral arms. She also has some eczematous lesions on her arms, neck, and cheeks. No honey-crusting or oozing noted. She has overall erythema present. Overall this is much better than it has previously been.   Neurological:     Mental Status: She is alert.  Psychiatric:        Behavior: Behavior is cooperative.      Diagnostic studies:    Spirometry: results abnormal (FEV1: 1.90/71%, FVC: 2.53/76%, FEV1/FVC: 75%).    Spirometry consistent with possible restrictive disease.    Allergy Studies: none      Marty Shaggy, MD  [facility] 

## 2024-03-02 NOTE — Patient Instructions (Addendum)
 1. Severe eczema - Continue with the Xolair . - Dr. Bonney is doing great with the cyclosporine.   - I think we should do the Nucala (which is an anti-IL5 antibody) rather than Fasenra (which binds to the IL-5 receptor on eosinophils).  [name] has a longer safety profile (now going on nearly 10 years) compared to Fasenra. - With you being on both Xolair  AND cyclosporine, I think going with the one with the longest, safest profile would be best.  - But I will talk to Dr. Bonney to see what she thinks as well.   2. Contact dermatitis due to chemicals - Continue to avoid all of your triggering chemicals.  3. Seasonal and perennial allergic rhinitis (grasses, weeds, ragweed, trees, molds, dust mites, cats, dogs, roach) - Continue with cetirizine 10mg  daily.   4. Recurrent infections - You were only protective to 2/23 serotypes of Streptococcus pneumonia. - The next step is to get the Pneumovax and then get repeat titers in 4-6 weeks.  - We can hold off on that for now.   5. 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  - EpiPen  is up to date.   6. Mild persistent asthma, uncomplicated - Lung testing looks much lower today. - You are using a LOT of albuterol , so I think we need to work on getting Nucala approved. - Information provided and consent provided.  - Tammy will call to discuss the approval process.  - Daily controller medication(s): Breztri 160/9/4.69mcg two puffs twice daily - Prior to physical activity: albuterol  2 puffs 10-15 minutes before physical activity. - Rescue medications: albuterol  4 puffs every 4-6 hours as needed - Asthma control goals:  * Full participation in all desired activities (may need albuterol  before activity) * Albuterol  use two time or less a week on average (not counting use with activity) * Cough interfering with sleep two time or less a month * Oral steroids no more than once a  year * No hospitalizations  7. Return in about 3 months (around 06/02/2024). You can have the follow up appointment with Dr. Iva or a Nurse Practicioner (our Nurse Practitioners are excellent and always have Physician oversight!).    Please inform us  of any Emergency Department visits, hospitalizations, or changes in symptoms. Call us  before going to the ED for breathing or allergy symptoms since we might be able to fit you in for a sick visit. Feel free to contact us  anytime with any questions, problems, or concerns.  It was a pleasure to see you againtoday!  Websites that have reliable patient information: 1. American Academy of Asthma, Allergy, and Immunology: www.aaaai.org 2. Food Allergy Research and Education (FARE): foodallergy.org 3. Mothers of Asthmatics: http://www.asthmacommunitynetwork.org 4. American College of Allergy, Asthma, and Immunology: www.acaai.org      "Like" us  on Facebook and Instagram for our latest updates!      A healthy democracy works best when [employer] participate! Make sure you are registered to vote! If you have moved or changed any of your contact information, you will need to get this updated before voting! Scan the QR codes below to learn more!

## 2024-03-03 ENCOUNTER — Ambulatory Visit

## 2024-03-06 ENCOUNTER — Encounter: Payer: Self-pay | Admitting: Allergy & Immunology

## 2024-03-07 ENCOUNTER — Ambulatory Visit (INDEPENDENT_AMBULATORY_CARE_PROVIDER_SITE_OTHER)

## 2024-03-07 DIAGNOSIS — Z91010 Allergy to peanuts: Secondary | ICD-10-CM

## 2024-03-24 ENCOUNTER — Ambulatory Visit

## 2024-03-24 DIAGNOSIS — D2272 Melanocytic nevi of left lower limb, including hip: Secondary | ICD-10-CM | POA: Diagnosis not present

## 2024-03-24 DIAGNOSIS — B078 Other viral warts: Secondary | ICD-10-CM | POA: Diagnosis not present

## 2024-03-24 DIAGNOSIS — Z91010 Allergy to peanuts: Secondary | ICD-10-CM | POA: Diagnosis not present

## 2024-03-24 DIAGNOSIS — L2089 Other atopic dermatitis: Secondary | ICD-10-CM | POA: Diagnosis not present

## 2024-03-24 DIAGNOSIS — Z79899 Other long term (current) drug therapy: Secondary | ICD-10-CM | POA: Diagnosis not present

## 2024-03-25 LAB — LAB REPORT - SCANNED: EGFR: 96

## 2024-03-27 NOTE — Progress Notes (Signed)
 [facility] Clinic Administered Medication Refill Coordination Note      NAME:Kamali Ruz DOB: Sep 28, 1975      Medication: XOLAIR  300 mg/2 mL syringe (omalizumab )    Day Supply: 28 days      SHIPPING      Next delivery from [facility] [phone]) to [facility] for Ryenn Jha is scheduled for 04/05/2024.    Clinic contact: Tammy    Patient's next nurse visit for administration: 03/31/2024.    We will follow up with clinic monthly for standard refill processing and delivery.      Lamarr CHRISTELLA Dross  [facility] Specialty Technician

## 2024-03-31 ENCOUNTER — Ambulatory Visit

## 2024-03-31 DIAGNOSIS — L209 Atopic dermatitis, unspecified: Secondary | ICD-10-CM

## 2024-03-31 DIAGNOSIS — J4540 Moderate persistent asthma, uncomplicated: Secondary | ICD-10-CM | POA: Diagnosis not present

## 2024-03-31 MED ORDER — MEPOLIZUMAB 100 MG ~~LOC~~ SOLR
100.0000 mg | SUBCUTANEOUS | Status: AC
  Administered 2024-03-31 – 2024-05-02 (×2): 100 mg via SUBCUTANEOUS

## 2024-03-31 NOTE — Progress Notes (Signed)
 Immunotherapy   Patient Details  Name: Cindy Huynh MRN: [id number] Date of Birth: 05/02/1975  03/31/2024  Cindy Huynh started injections for  Nucala  Frequency: Every 28 days Consent signed and patient instructions given. Patient waited in office 15 minutes with no issues.    Cindy Huynh Shed 03/31/2024, 9:44 AM

## 2024-04-04 MED FILL — XOLAIR 300 MG/2 ML SUBCUTANEOUS SYRINGE: SUBCUTANEOUS | 28 days supply | Qty: 8 | Fill #6

## 2024-04-07 ENCOUNTER — Ambulatory Visit

## 2024-04-07 DIAGNOSIS — Z91010 Allergy to peanuts: Secondary | ICD-10-CM

## 2024-04-11 NOTE — Progress Notes (Signed)
 Their way of trying to deny meds lol

## 2024-04-21 ENCOUNTER — Ambulatory Visit

## 2024-04-21 DIAGNOSIS — Z91010 Allergy to peanuts: Secondary | ICD-10-CM

## 2024-04-28 ENCOUNTER — Ambulatory Visit

## 2024-05-02 ENCOUNTER — Ambulatory Visit

## 2024-05-02 DIAGNOSIS — J4540 Moderate persistent asthma, uncomplicated: Secondary | ICD-10-CM

## 2024-05-03 ENCOUNTER — Other Ambulatory Visit (HOSPITAL_COMMUNITY): Payer: Self-pay

## 2024-05-03 ENCOUNTER — Telehealth: Payer: Self-pay | Admitting: *Deleted

## 2024-05-03 MED ORDER — NUCALA 100 MG/ML ~~LOC~~ SOAJ: 100.0000 mg | SUBCUTANEOUS | 11 refills | Status: AC

## 2024-05-03 MED ORDER — NUCALA 100 MG/ML SUBCUTANEOUS AUTO-INJECTOR
SUBCUTANEOUS | 11 refills | 0.00000 days
Start: 2024-05-03 — End: ?

## 2024-05-03 NOTE — Telephone Encounter (Signed)
-----   Message from Marty Shaggy, MD sent at 04/21/2024 12:48 PM EST ----- I forwarded you a CBC from December 5th. AEC 381 at that point. ----- Message ----- From: Otha Madelin HERO, CMA Sent: 04/11/2024  11:22 AM EST To: Marty Morton Shaggy, MD

## 2024-05-03 NOTE — Telephone Encounter (Signed)
 Called patient and advised approval, copay card and submit to [facility] for Nucala  100mg  autoinjector she will admin at home

## 2024-05-03 NOTE — Telephone Encounter (Signed)
 Great work!  I hope it helps her.

## 2024-05-05 ENCOUNTER — Ambulatory Visit (INDEPENDENT_AMBULATORY_CARE_PROVIDER_SITE_OTHER)

## 2024-05-05 DIAGNOSIS — Z91010 Allergy to peanuts: Secondary | ICD-10-CM | POA: Diagnosis not present

## 2024-05-17 NOTE — Progress Notes (Signed)
 [facility] Specialty Medication Onboarding    Specialty Medication: Nucala  100mg /mL autoinjector  Prior Authorization: Approved   Financial Assistance: No - copay card is available but patient is required to apply  Copay/Day Supply: $200 / 28 days    Insurance Restrictions: Yes - max 1 month supply     Notes to Pharmacist: Prescriber sent copay card info for pt, but it doesn't work (invalid DOB which likely means wrong ID#)  Credit Card on File: no  Start Date on Rx:  N/A  Delivery Method (based on home address currently on file): UPS no restrictions      The triage team has completed the benefits investigation and has determined that the patient is able to fill this medication at [facility]. Please contact the patient to complete the onboarding or follow up with the prescribing physician as needed.

## 2024-05-19 ENCOUNTER — Ambulatory Visit

## 2024-05-19 NOTE — Progress Notes (Unsigned)
 **INCOMPLETE**  1/30: Copay card provided by pt has invalid member ID. Annella agreed to call mfg to clarify copay card information and will call back with an update.      [facility]    Patient Onboarding/Medication Counseling    Annette Carpenter is a 49 y.o. female with severe persistent asthma who I am counseling today on {Blank:19197::initiation,continuation} of therapy.  I am speaking to {Blank:19197::the patient,the patient's caregiver, ***,the patient's family member, ***,***}.    Was a nurse, learning disability used for this call? No    Verified patient's date of birth / HIPAA.    Specialty medication(s) to be sent: CF/Pulmonary/Asthma: Nucala       Non-specialty medications/supplies to be sent: ***      Medications not needed at this time: ***         Nucala  (mepolizumab )    Medication & Administration     Dosage: Nucala  100mg /ml Auto-injector: Inject 1 ml (100mg ) SQ once every 4 weeks    Is this the patient's first time using Nucala  at home?: {Blank single:19197:: Yes, the first injection should be administered in clinic under supervision of a medical provider where they will provide proper injection technique and monitor for allergic response.  Please contact the clinic at [phone] to make an appointment for the first injection and bring Nucala  and Epi-pen with you to the appointment,No, patient has been on this medication before, has been counseled on proper injection technique, and is aware of how to recognize and treat an anaphylaxis reaction}      Administration:     Auto-Injector:  Gather all supplies needed for injection on a clean, flat working surface: medication syringe(s) removed from packaging, alcohol swab, sharps container, etc.  Look at the medication label - look for correct medication, correct dose, and check the expiration date  Look at the medication - the liquid in the syringe should appear clear and colorless to slightly yellow. Do not use if the solution is cloudy, leaking, or has particles  Lay the syringe on a flat surface and allow it to warm up to room temperature for at least 30 minutes  Select injection site - you can use the front of your thigh or your belly (but not the area 2 inches around your belly button); if someone else is giving you the injection you can also use your upper arm in the skin covering your triceps muscle  Prepare injection site - wash your hands and clean the skin at the injection site with an alcohol swab and let it air dry, do not touch the injection site again before the injection  Pull off the needle safety cap, do not remove until immediately prior to injection; Inject within 5 minutes of removing the clear needle cap. Do not touch the yellow needle guard or put the cap back on.   With the inspection window facing you, press auto-injector straight on injection site. Hold it down and push it down against skin. This will make yellow needle guard slide up   You should hear a click to let you know injection has started   The yellow indicator will move down through inspection window as you are completing your dose   It may take 15 seconds to get the full dose   Continue holding until you hear the second click. The inspection window should be filled with the yellow indicator   Continue to hold for another 5 seconds after you hear the 2nd click   Dispose  of the used syringe immediately in your sharps disposal container, do not attempt to recap the needle prior to disposing  If you see any blood at the injection site, press a cotton ball or gauze on the site and maintain pressure until the bleeding stops, do not rub the injection site      Adherence/Missed dose instructions:  Take missed dose as soon as you remember. If it is close to the time of your next dose, skip the dose and resume with your next scheduled dose.    Goals of Therapy     {NucalaGoalsofTherapy:120671}    Side Effects & Monitoring Parameters     Headache   Back pain   Feeling tired or weak   Redness or swelling at injection site   Pain or burning where the drug was used    The following side effects should be reported to the provider:  Signs of allergic reaction such as rash, hives, itching, red, swollen, blistered, or peeling skin, trouble breathing or swallowing, swelling of the mouth, face, lips, tongue, or throat   Dizziness or passing out   Very bad headache   Flushing   Feeling hot or cold   Shortness of breath      Contraindications, Warnings, & Precautions     Hypersensitivity to mepolizumab  or any components of the formualy    Drug/Food Interactions     Medication list reviewed in Epic. The patient was instructed to inform the care team before taking any new medications or supplements. {Blank single:19197::No drug interactions identified,***}.   SABRA     Storage, Handling Precautions, & Disposal   Prefilled autoinjector/syringe: Store at 2??C to 8??C (36??F to 46??F). Do not freeze; protect from light. Do not shake. An unopened carton may be stored at <=30??C (86??F) for <=7 days; discard if kept at room temperature for >7 days. Once autoinjector/syringe is removed from carton must administer within 8 hours; otherwise discard.      Current Medications (including OTC/herbals), Comorbidities and Allergies     Current Medications[1]    Allergies[2]    There is no problem list on file for this patient.[3]    Medication list has been reviewed and updated in Epic: {Blank single:19197::Yes,***}    Allergies have been reviewed and updated in Epic: {Blank single:19197::Yes,***}    Appropriateness of Therapy     Acute infections noted within Epic:  No active infections  Patient reported infection: {Blank single:19197::None,***- patient reported to provider,***- pharmacy reported to provider}    Is the medication and dose appropriate considering the patient???s diagnosis, treatment, and disease journey, comorbidities, medical history, current medications, allergies, therapeutic goals, self-administration ability, and access barriers? {Blank single:19197::Yes,No - evidence provided by prescriber in *** note}    Prescription has been clinically reviewed: {Blank single:19197::Yes,***}      Baseline Quality of Life Assessment      How many days over the past month did your severe persistent asthma  keep you from your normal activities? For example, brushing your teeth or getting up in the morning. {Blank:19197::***,0,Patient declined to answer}    Financial Information     Medication Assistance provided: {sscmedassist:65303}    Anticipated copay of $*** reviewed with patient. Verified delivery address.    Delivery Information     Scheduled delivery date: ***    Expected start date: ***      Medication will be delivered via {Blank:19197::UPS,Next Day Courier,Clinic Courier - *** clinic,***} to the {Blank:19197::prescription,temporary} address in Epic WAM.  This shipment {  Blank single:19197::will,will not} require a signature.      Explained the services we provide at [facility] and that each month we would call to set up refills.  Stressed importance of returning phone calls so that we could ensure they receive their medications in time each month.  Informed patient that we should be setting up refills 7-10 days prior to when they will run out of medication.  A pharmacist will reach out to perform a clinical assessment periodically.  Informed patient that a welcome packet, containing information about our pharmacy and other support services, a Notice of Privacy Practices, and a drug information handout will be sent.      The patient or caregiver noted above participated in the development of this care plan and knows that they can request review of or adjustments to the care plan at any time.      Patient or caregiver verbalized understanding of the above information as well as how to contact the pharmacy at [phone] option 4 with any questions/concerns.  The pharmacy is open Monday through Friday 8:30am-4:30pm.  A pharmacist is available 24/7 via pager to answer any clinical questions they may have.    Patient Specific Needs     Does the patient have any physical, cognitive, or cultural barriers? {Blank single:19197::No,Yes - ***}    Does the patient have adequate living arrangements? (i.e. the ability to store and take their medication appropriately) {Blank single:19197::Yes,No - ***}    Did you identify any home environmental safety or security hazards? {Blank single:19197::No,Yes - ***}    Patient prefers to have medications discussed with  {Blank single:19197::Patient,Family Member,Caregiver,Other}     Is the patient or caregiver able to read and understand education materials at a high school level or above? {Blank single:19197::No,Yes}    Patient's primary language is  {Blank single:19197::English,Spanish,***}     Is the patient high risk? {sschighriskpts:78327}    Does the patient have an additional or emergency contact listed in their chart? {Blank single:19197::Yes,No, patient refused.,***}    SOCIAL DETERMINANTS OF HEALTH     At the [facility], we have learned that life circumstances - like trouble affording food, housing, utilities, or transportation can affect the health of many of our patients.   That is why we wanted to ask: are you currently experiencing any life circumstances that are negatively impacting your health and/or quality of life? {YES/NO/PATIENTDECLINED:93004}    Social Drivers of Health     Food Insecurity: Not on file   Tobacco Use: High Risk (08/17/2023)    Received from [facility]    Patient History     Smoking Tobacco Use: Every Day     Smokeless Tobacco Use: Never     Passive Exposure: Never   Transportation Needs: Not on file   Alcohol Use: Not on file   Housing: Not on file   Physical Activity: Not on file   Utilities: Not on file   Stress: Not on file Interpersonal Safety: Not on file   Substance Use: Not on file   Intimate Partner Violence: Not on file   Social Connections: Not on file   Financial Resource Strain: Not on file   Health Literacy: Not on file   Internet Connectivity: Not on file       Would you be willing to receive help with any of the needs that you have identified today? {Yes/No/Not applicable:93005}       Shelba DELENA Hummer, PharmD  [facility] Specialty Pharmacist       [1]   Current Outpatient Medications   Medication Sig Dispense Refill    albuterol HFA 90 mcg/actuation inhaler Inhale 2 puffs every six (6) hours as needed for wheezing.      cetirizine (ZYRTEC) 10 MG tablet Take 1 tablet (10 mg total) by mouth daily.      fluocinonide (LIDEX) 0.05 % gel Apply 1 Application topically as needed.      hydroCHLOROthiazide (HYDRODIURIL) 25 MG tablet Take 1 tablet (25 mg total) by mouth daily.      hydrocortisone 2.5 % cream Apply topically daily.      mepolizumab  (NUCALA ) 100 mg/mL AtIn Inject 1 mL under the skin every twenty-eight (28) days. 1 mL 11    omalizumab  (XOLAIR ) 300 mg/2 mL syringe Inject the contents of 2 syringes (600 mg total) under the skin every fourteen (14) days. 8 mL 11    upadacitinib  (RINVOQ ) 30 mg tablet Take 1 tablet by mouth once a day 30 tablet 11     No current facility-administered medications for this visit.   [2]   Allergies  Allergen Reactions    Peanut Anaphylaxis    Shellfish Containing Products Anaphylaxis    Tree Nuts Anaphylaxis    Avocado Nausea And Vomiting and Rash   [3] There is no problem list on file for this patient.

## 2024-05-26 ENCOUNTER — Ambulatory Visit

## 2024-06-02 ENCOUNTER — Ambulatory Visit

## 2024-06-06 ENCOUNTER — Ambulatory Visit: Admitting: Allergy & Immunology
